# Patient Record
Sex: Male | Born: 1945 | Race: White | Hispanic: No | Marital: Married | Smoking: Former smoker
Health system: Southern US, Community
[De-identification: ages and names within clinical notes are randomized; demographics above are authoritative.]

## PROBLEM LIST (undated history)

## (undated) DIAGNOSIS — R7303 Prediabetes: Secondary | ICD-10-CM

## (undated) DIAGNOSIS — J302 Other seasonal allergic rhinitis: Secondary | ICD-10-CM

## (undated) DIAGNOSIS — R569 Unspecified convulsions: Secondary | ICD-10-CM

## (undated) DIAGNOSIS — E785 Hyperlipidemia, unspecified: Secondary | ICD-10-CM

## (undated) DIAGNOSIS — Z8613 Personal history of malaria: Secondary | ICD-10-CM

## (undated) DIAGNOSIS — Z9109 Other allergy status, other than to drugs and biological substances: Secondary | ICD-10-CM

## (undated) DIAGNOSIS — G44009 Cluster headache syndrome, unspecified, not intractable: Secondary | ICD-10-CM

## (undated) DIAGNOSIS — C155 Malignant neoplasm of lower third of esophagus: Secondary | ICD-10-CM

## (undated) DIAGNOSIS — Z8619 Personal history of other infectious and parasitic diseases: Secondary | ICD-10-CM

## (undated) DIAGNOSIS — I10 Essential (primary) hypertension: Secondary | ICD-10-CM

## (undated) DIAGNOSIS — M1990 Unspecified osteoarthritis, unspecified site: Secondary | ICD-10-CM

## (undated) DIAGNOSIS — I2510 Atherosclerotic heart disease of native coronary artery without angina pectoris: Secondary | ICD-10-CM

## (undated) DIAGNOSIS — I82409 Acute embolism and thrombosis of unspecified deep veins of unspecified lower extremity: Secondary | ICD-10-CM

## (undated) DIAGNOSIS — I2699 Other pulmonary embolism without acute cor pulmonale: Secondary | ICD-10-CM

## (undated) HISTORY — DX: Unspecified osteoarthritis, unspecified site: M19.90

## (undated) HISTORY — DX: Personal history of other infectious and parasitic diseases: Z86.19

## (undated) HISTORY — DX: Malignant neoplasm of lower third of esophagus: C15.5

## (undated) HISTORY — DX: Hyperlipidemia, unspecified: E78.5

## (undated) HISTORY — DX: Essential (primary) hypertension: I10

## (undated) HISTORY — DX: Cluster headache syndrome, unspecified, not intractable: G44.009

## (undated) HISTORY — DX: Personal history of malaria: Z86.13

## (undated) HISTORY — DX: Other seasonal allergic rhinitis: J30.2

## (undated) HISTORY — DX: Other pulmonary embolism without acute cor pulmonale: I26.99

## (undated) HISTORY — PX: TOTAL KNEE ARTHROPLASTY: SHX125

## (undated) HISTORY — DX: Prediabetes: R73.03

---

## 1967-04-05 DIAGNOSIS — Z8613 Personal history of malaria: Secondary | ICD-10-CM

## 1967-04-05 HISTORY — DX: Personal history of malaria: Z86.13

## 1999-11-03 HISTORY — PX: ANKLE SURGERY: SHX546

## 2002-02-02 HISTORY — PX: JOINT REPLACEMENT: SHX530

## 2004-05-03 ENCOUNTER — Ambulatory Visit: Payer: Self-pay | Admitting: Family Medicine

## 2005-04-04 DIAGNOSIS — R7303 Prediabetes: Secondary | ICD-10-CM

## 2005-04-04 HISTORY — DX: Prediabetes: R73.03

## 2006-04-04 HISTORY — PX: COLONOSCOPY: SHX174

## 2006-04-04 LAB — HM COLONOSCOPY

## 2006-11-22 ENCOUNTER — Ambulatory Visit: Payer: Self-pay | Admitting: Unknown Physician Specialty

## 2006-11-27 ENCOUNTER — Ambulatory Visit: Payer: Self-pay | Admitting: General Practice

## 2011-09-12 DIAGNOSIS — J309 Allergic rhinitis, unspecified: Secondary | ICD-10-CM | POA: Diagnosis not present

## 2012-12-31 DIAGNOSIS — M1710 Unilateral primary osteoarthritis, unspecified knee: Secondary | ICD-10-CM | POA: Diagnosis not present

## 2013-01-10 DIAGNOSIS — M1710 Unilateral primary osteoarthritis, unspecified knee: Secondary | ICD-10-CM | POA: Diagnosis not present

## 2013-01-11 DIAGNOSIS — Z23 Encounter for immunization: Secondary | ICD-10-CM | POA: Diagnosis not present

## 2013-02-01 DIAGNOSIS — J209 Acute bronchitis, unspecified: Secondary | ICD-10-CM | POA: Diagnosis not present

## 2013-02-01 DIAGNOSIS — J069 Acute upper respiratory infection, unspecified: Secondary | ICD-10-CM | POA: Diagnosis not present

## 2013-02-05 ENCOUNTER — Ambulatory Visit: Payer: Self-pay | Admitting: General Practice

## 2013-02-05 DIAGNOSIS — Z823 Family history of stroke: Secondary | ICD-10-CM | POA: Diagnosis not present

## 2013-02-05 DIAGNOSIS — IMO0002 Reserved for concepts with insufficient information to code with codable children: Secondary | ICD-10-CM | POA: Diagnosis not present

## 2013-02-05 DIAGNOSIS — E119 Type 2 diabetes mellitus without complications: Secondary | ICD-10-CM | POA: Diagnosis not present

## 2013-02-05 DIAGNOSIS — G43909 Migraine, unspecified, not intractable, without status migrainosus: Secondary | ICD-10-CM | POA: Diagnosis not present

## 2013-02-05 DIAGNOSIS — Z01812 Encounter for preprocedural laboratory examination: Secondary | ICD-10-CM | POA: Diagnosis not present

## 2013-02-05 DIAGNOSIS — Z79899 Other long term (current) drug therapy: Secondary | ICD-10-CM | POA: Diagnosis not present

## 2013-02-05 DIAGNOSIS — Z7982 Long term (current) use of aspirin: Secondary | ICD-10-CM | POA: Diagnosis not present

## 2013-02-05 DIAGNOSIS — Z9109 Other allergy status, other than to drugs and biological substances: Secondary | ICD-10-CM | POA: Diagnosis not present

## 2013-02-05 DIAGNOSIS — I10 Essential (primary) hypertension: Secondary | ICD-10-CM | POA: Diagnosis not present

## 2013-02-05 DIAGNOSIS — Z9889 Other specified postprocedural states: Secondary | ICD-10-CM | POA: Diagnosis not present

## 2013-02-05 DIAGNOSIS — I119 Hypertensive heart disease without heart failure: Secondary | ICD-10-CM | POA: Diagnosis not present

## 2013-02-05 DIAGNOSIS — Z8249 Family history of ischemic heart disease and other diseases of the circulatory system: Secondary | ICD-10-CM | POA: Diagnosis not present

## 2013-02-05 DIAGNOSIS — Z01810 Encounter for preprocedural cardiovascular examination: Secondary | ICD-10-CM | POA: Diagnosis not present

## 2013-02-05 DIAGNOSIS — Z833 Family history of diabetes mellitus: Secondary | ICD-10-CM | POA: Diagnosis not present

## 2013-02-05 LAB — URINALYSIS, COMPLETE
Blood: NEGATIVE
Glucose,UR: NEGATIVE mg/dL (ref 0–75)
Ketone: NEGATIVE
Nitrite: NEGATIVE
Ph: 6 (ref 4.5–8.0)
Protein: NEGATIVE
RBC,UR: <1 /HPF (ref 0–5)
Specific Gravity: 1.006 (ref 1.003–1.030)
Squamous Epithelial: <1
WBC UR: <1 /HPF (ref 0–5)

## 2013-02-05 LAB — CBC
HCT: 41.4 % (ref 40.0–52.0)
MCH: 31.8 pg (ref 26.0–34.0)
MCV: 90 fL (ref 80–100)
Platelet: 204 10*3/uL (ref 150–440)
RDW: 13.6 % (ref 11.5–14.5)
WBC: 6.2 10*3/uL (ref 3.8–10.6)

## 2013-02-05 LAB — BASIC METABOLIC PANEL
Anion Gap: 3 — ABNORMAL LOW (ref 7–16)
Calcium, Total: 9.6 mg/dL (ref 8.5–10.1)
Co2: 29 mmol/L (ref 21–32)
Creatinine: 0.94 mg/dL (ref 0.60–1.30)
EGFR (Non-African Amer.): >60
Glucose: 97 mg/dL (ref 65–99)
Potassium: 4.1 mmol/L (ref 3.5–5.1)
Sodium: 137 mmol/L (ref 136–145)

## 2013-02-05 LAB — MRSA PCR SCREENING

## 2013-02-05 LAB — APTT: Activated PTT: 33.9 secs (ref 23.6–35.9)

## 2013-02-05 LAB — PROTIME-INR
INR: 1
Prothrombin Time: 13.6 secs (ref 11.5–14.7)

## 2013-02-20 ENCOUNTER — Inpatient Hospital Stay: Payer: Self-pay | Admitting: General Practice

## 2013-02-20 DIAGNOSIS — Z87891 Personal history of nicotine dependence: Secondary | ICD-10-CM | POA: Diagnosis not present

## 2013-02-20 DIAGNOSIS — M1710 Unilateral primary osteoarthritis, unspecified knee: Secondary | ICD-10-CM | POA: Diagnosis not present

## 2013-02-20 DIAGNOSIS — D62 Acute posthemorrhagic anemia: Secondary | ICD-10-CM | POA: Diagnosis not present

## 2013-02-20 DIAGNOSIS — Z471 Aftercare following joint replacement surgery: Secondary | ICD-10-CM | POA: Diagnosis not present

## 2013-02-20 DIAGNOSIS — E119 Type 2 diabetes mellitus without complications: Secondary | ICD-10-CM | POA: Diagnosis present

## 2013-02-20 DIAGNOSIS — G43909 Migraine, unspecified, not intractable, without status migrainosus: Secondary | ICD-10-CM | POA: Diagnosis present

## 2013-02-20 DIAGNOSIS — M129 Arthropathy, unspecified: Secondary | ICD-10-CM | POA: Diagnosis present

## 2013-02-20 DIAGNOSIS — Z7982 Long term (current) use of aspirin: Secondary | ICD-10-CM | POA: Diagnosis not present

## 2013-02-20 DIAGNOSIS — Z96659 Presence of unspecified artificial knee joint: Secondary | ICD-10-CM | POA: Diagnosis not present

## 2013-02-21 LAB — BASIC METABOLIC PANEL
BUN: 15 mg/dL (ref 7–18)
Calcium, Total: 8.3 mg/dL — ABNORMAL LOW (ref 8.5–10.1)
Chloride: 103 mmol/L (ref 98–107)
Creatinine: 0.96 mg/dL (ref 0.60–1.30)
EGFR (Non-African Amer.): >60
Glucose: 117 mg/dL — ABNORMAL HIGH (ref 65–99)
Osmolality: 268 (ref 275–301)
Potassium: 3.8 mmol/L (ref 3.5–5.1)

## 2013-02-21 LAB — HEMOGLOBIN: HGB: 11.5 g/dL — ABNORMAL LOW (ref 13.0–18.0)

## 2013-02-22 LAB — PLATELET COUNT: Platelet: 137 10*3/uL — ABNORMAL LOW (ref 150–440)

## 2013-02-22 LAB — BASIC METABOLIC PANEL
Anion Gap: 2 — ABNORMAL LOW (ref 7–16)
BUN: 13 mg/dL (ref 7–18)
Chloride: 107 mmol/L (ref 98–107)
Osmolality: 279 (ref 275–301)

## 2013-02-22 LAB — HEMOGLOBIN: HGB: 10.9 g/dL — ABNORMAL LOW (ref 13.0–18.0)

## 2013-02-23 DIAGNOSIS — Z96659 Presence of unspecified artificial knee joint: Secondary | ICD-10-CM | POA: Diagnosis not present

## 2013-02-23 DIAGNOSIS — I10 Essential (primary) hypertension: Secondary | ICD-10-CM | POA: Diagnosis not present

## 2013-02-23 DIAGNOSIS — Z7901 Long term (current) use of anticoagulants: Secondary | ICD-10-CM | POA: Diagnosis not present

## 2013-02-23 DIAGNOSIS — IMO0001 Reserved for inherently not codable concepts without codable children: Secondary | ICD-10-CM | POA: Diagnosis not present

## 2013-02-23 DIAGNOSIS — Z471 Aftercare following joint replacement surgery: Secondary | ICD-10-CM | POA: Diagnosis not present

## 2013-02-24 DIAGNOSIS — I10 Essential (primary) hypertension: Secondary | ICD-10-CM | POA: Diagnosis not present

## 2013-02-24 DIAGNOSIS — IMO0001 Reserved for inherently not codable concepts without codable children: Secondary | ICD-10-CM | POA: Diagnosis not present

## 2013-02-24 DIAGNOSIS — Z7901 Long term (current) use of anticoagulants: Secondary | ICD-10-CM | POA: Diagnosis not present

## 2013-02-24 DIAGNOSIS — Z96659 Presence of unspecified artificial knee joint: Secondary | ICD-10-CM | POA: Diagnosis not present

## 2013-02-24 DIAGNOSIS — Z471 Aftercare following joint replacement surgery: Secondary | ICD-10-CM | POA: Diagnosis not present

## 2013-02-25 DIAGNOSIS — I10 Essential (primary) hypertension: Secondary | ICD-10-CM | POA: Diagnosis not present

## 2013-02-25 DIAGNOSIS — Z7901 Long term (current) use of anticoagulants: Secondary | ICD-10-CM | POA: Diagnosis not present

## 2013-02-25 DIAGNOSIS — Z96659 Presence of unspecified artificial knee joint: Secondary | ICD-10-CM | POA: Diagnosis not present

## 2013-02-25 DIAGNOSIS — Z471 Aftercare following joint replacement surgery: Secondary | ICD-10-CM | POA: Diagnosis not present

## 2013-02-25 DIAGNOSIS — IMO0001 Reserved for inherently not codable concepts without codable children: Secondary | ICD-10-CM | POA: Diagnosis not present

## 2013-02-26 DIAGNOSIS — Z96659 Presence of unspecified artificial knee joint: Secondary | ICD-10-CM | POA: Diagnosis not present

## 2013-02-26 DIAGNOSIS — I10 Essential (primary) hypertension: Secondary | ICD-10-CM | POA: Diagnosis not present

## 2013-02-26 DIAGNOSIS — Z7901 Long term (current) use of anticoagulants: Secondary | ICD-10-CM | POA: Diagnosis not present

## 2013-02-26 DIAGNOSIS — Z471 Aftercare following joint replacement surgery: Secondary | ICD-10-CM | POA: Diagnosis not present

## 2013-02-26 DIAGNOSIS — IMO0001 Reserved for inherently not codable concepts without codable children: Secondary | ICD-10-CM | POA: Diagnosis not present

## 2013-02-27 DIAGNOSIS — IMO0001 Reserved for inherently not codable concepts without codable children: Secondary | ICD-10-CM | POA: Diagnosis not present

## 2013-02-27 DIAGNOSIS — Z96659 Presence of unspecified artificial knee joint: Secondary | ICD-10-CM | POA: Diagnosis not present

## 2013-02-27 DIAGNOSIS — Z471 Aftercare following joint replacement surgery: Secondary | ICD-10-CM | POA: Diagnosis not present

## 2013-02-27 DIAGNOSIS — Z7901 Long term (current) use of anticoagulants: Secondary | ICD-10-CM | POA: Diagnosis not present

## 2013-02-27 DIAGNOSIS — I10 Essential (primary) hypertension: Secondary | ICD-10-CM | POA: Diagnosis not present

## 2013-02-28 DIAGNOSIS — Z96659 Presence of unspecified artificial knee joint: Secondary | ICD-10-CM | POA: Diagnosis not present

## 2013-02-28 DIAGNOSIS — Z7901 Long term (current) use of anticoagulants: Secondary | ICD-10-CM | POA: Diagnosis not present

## 2013-02-28 DIAGNOSIS — Z471 Aftercare following joint replacement surgery: Secondary | ICD-10-CM | POA: Diagnosis not present

## 2013-02-28 DIAGNOSIS — I10 Essential (primary) hypertension: Secondary | ICD-10-CM | POA: Diagnosis not present

## 2013-02-28 DIAGNOSIS — IMO0001 Reserved for inherently not codable concepts without codable children: Secondary | ICD-10-CM | POA: Diagnosis not present

## 2013-03-04 DIAGNOSIS — Z96659 Presence of unspecified artificial knee joint: Secondary | ICD-10-CM | POA: Diagnosis not present

## 2013-03-04 DIAGNOSIS — Z471 Aftercare following joint replacement surgery: Secondary | ICD-10-CM | POA: Diagnosis not present

## 2013-03-04 DIAGNOSIS — I10 Essential (primary) hypertension: Secondary | ICD-10-CM | POA: Diagnosis not present

## 2013-03-04 DIAGNOSIS — Z7901 Long term (current) use of anticoagulants: Secondary | ICD-10-CM | POA: Diagnosis not present

## 2013-03-04 DIAGNOSIS — IMO0001 Reserved for inherently not codable concepts without codable children: Secondary | ICD-10-CM | POA: Diagnosis not present

## 2013-03-05 DIAGNOSIS — IMO0001 Reserved for inherently not codable concepts without codable children: Secondary | ICD-10-CM | POA: Diagnosis not present

## 2013-03-05 DIAGNOSIS — Z96659 Presence of unspecified artificial knee joint: Secondary | ICD-10-CM | POA: Diagnosis not present

## 2013-03-05 DIAGNOSIS — Z471 Aftercare following joint replacement surgery: Secondary | ICD-10-CM | POA: Diagnosis not present

## 2013-03-05 DIAGNOSIS — Z7901 Long term (current) use of anticoagulants: Secondary | ICD-10-CM | POA: Diagnosis not present

## 2013-03-05 DIAGNOSIS — I10 Essential (primary) hypertension: Secondary | ICD-10-CM | POA: Diagnosis not present

## 2013-03-06 DIAGNOSIS — Z96659 Presence of unspecified artificial knee joint: Secondary | ICD-10-CM | POA: Diagnosis not present

## 2013-03-06 DIAGNOSIS — IMO0001 Reserved for inherently not codable concepts without codable children: Secondary | ICD-10-CM | POA: Diagnosis not present

## 2013-03-06 DIAGNOSIS — Z7901 Long term (current) use of anticoagulants: Secondary | ICD-10-CM | POA: Diagnosis not present

## 2013-03-06 DIAGNOSIS — I10 Essential (primary) hypertension: Secondary | ICD-10-CM | POA: Diagnosis not present

## 2013-03-06 DIAGNOSIS — Z471 Aftercare following joint replacement surgery: Secondary | ICD-10-CM | POA: Diagnosis not present

## 2013-03-07 DIAGNOSIS — M25569 Pain in unspecified knee: Secondary | ICD-10-CM | POA: Diagnosis not present

## 2013-03-07 DIAGNOSIS — M25669 Stiffness of unspecified knee, not elsewhere classified: Secondary | ICD-10-CM | POA: Diagnosis not present

## 2013-03-07 DIAGNOSIS — M6281 Muscle weakness (generalized): Secondary | ICD-10-CM | POA: Diagnosis not present

## 2013-03-11 DIAGNOSIS — M6281 Muscle weakness (generalized): Secondary | ICD-10-CM | POA: Diagnosis not present

## 2013-03-11 DIAGNOSIS — M25669 Stiffness of unspecified knee, not elsewhere classified: Secondary | ICD-10-CM | POA: Diagnosis not present

## 2013-03-11 DIAGNOSIS — M25569 Pain in unspecified knee: Secondary | ICD-10-CM | POA: Diagnosis not present

## 2013-03-13 DIAGNOSIS — M25569 Pain in unspecified knee: Secondary | ICD-10-CM | POA: Diagnosis not present

## 2013-03-13 DIAGNOSIS — M25669 Stiffness of unspecified knee, not elsewhere classified: Secondary | ICD-10-CM | POA: Diagnosis not present

## 2013-03-13 DIAGNOSIS — M6281 Muscle weakness (generalized): Secondary | ICD-10-CM | POA: Diagnosis not present

## 2013-03-15 DIAGNOSIS — M25569 Pain in unspecified knee: Secondary | ICD-10-CM | POA: Diagnosis not present

## 2013-03-15 DIAGNOSIS — M6281 Muscle weakness (generalized): Secondary | ICD-10-CM | POA: Diagnosis not present

## 2013-03-15 DIAGNOSIS — M25669 Stiffness of unspecified knee, not elsewhere classified: Secondary | ICD-10-CM | POA: Diagnosis not present

## 2013-03-18 DIAGNOSIS — M25669 Stiffness of unspecified knee, not elsewhere classified: Secondary | ICD-10-CM | POA: Diagnosis not present

## 2013-03-18 DIAGNOSIS — M6281 Muscle weakness (generalized): Secondary | ICD-10-CM | POA: Diagnosis not present

## 2013-03-18 DIAGNOSIS — M25569 Pain in unspecified knee: Secondary | ICD-10-CM | POA: Diagnosis not present

## 2013-03-20 DIAGNOSIS — M6281 Muscle weakness (generalized): Secondary | ICD-10-CM | POA: Diagnosis not present

## 2013-03-20 DIAGNOSIS — M25669 Stiffness of unspecified knee, not elsewhere classified: Secondary | ICD-10-CM | POA: Diagnosis not present

## 2013-03-20 DIAGNOSIS — M25569 Pain in unspecified knee: Secondary | ICD-10-CM | POA: Diagnosis not present

## 2013-03-25 DIAGNOSIS — M25669 Stiffness of unspecified knee, not elsewhere classified: Secondary | ICD-10-CM | POA: Diagnosis not present

## 2013-03-25 DIAGNOSIS — M6281 Muscle weakness (generalized): Secondary | ICD-10-CM | POA: Diagnosis not present

## 2013-03-25 DIAGNOSIS — M25569 Pain in unspecified knee: Secondary | ICD-10-CM | POA: Diagnosis not present

## 2013-04-01 DIAGNOSIS — M6281 Muscle weakness (generalized): Secondary | ICD-10-CM | POA: Diagnosis not present

## 2013-04-01 DIAGNOSIS — M25569 Pain in unspecified knee: Secondary | ICD-10-CM | POA: Diagnosis not present

## 2013-04-01 DIAGNOSIS — M25669 Stiffness of unspecified knee, not elsewhere classified: Secondary | ICD-10-CM | POA: Diagnosis not present

## 2013-04-03 DIAGNOSIS — M25619 Stiffness of unspecified shoulder, not elsewhere classified: Secondary | ICD-10-CM | POA: Diagnosis not present

## 2013-04-03 DIAGNOSIS — M6281 Muscle weakness (generalized): Secondary | ICD-10-CM | POA: Diagnosis not present

## 2013-04-03 DIAGNOSIS — M25519 Pain in unspecified shoulder: Secondary | ICD-10-CM | POA: Diagnosis not present

## 2013-04-09 DIAGNOSIS — S8290XD Unspecified fracture of unspecified lower leg, subsequent encounter for closed fracture with routine healing: Secondary | ICD-10-CM | POA: Diagnosis not present

## 2013-04-16 ENCOUNTER — Ambulatory Visit (INDEPENDENT_AMBULATORY_CARE_PROVIDER_SITE_OTHER): Payer: Medicare Other | Admitting: Family Medicine

## 2013-04-16 ENCOUNTER — Encounter: Payer: Self-pay | Admitting: Family Medicine

## 2013-04-16 VITALS — BP 112/70 | HR 94 | Temp 98.1°F | Ht 75.0 in | Wt 264.5 lb

## 2013-04-16 DIAGNOSIS — E785 Hyperlipidemia, unspecified: Secondary | ICD-10-CM | POA: Diagnosis not present

## 2013-04-16 DIAGNOSIS — R7309 Other abnormal glucose: Secondary | ICD-10-CM | POA: Diagnosis not present

## 2013-04-16 DIAGNOSIS — J309 Allergic rhinitis, unspecified: Secondary | ICD-10-CM

## 2013-04-16 DIAGNOSIS — E119 Type 2 diabetes mellitus without complications: Secondary | ICD-10-CM | POA: Insufficient documentation

## 2013-04-16 DIAGNOSIS — R7303 Prediabetes: Secondary | ICD-10-CM

## 2013-04-16 DIAGNOSIS — J302 Other seasonal allergic rhinitis: Secondary | ICD-10-CM | POA: Insufficient documentation

## 2013-04-16 DIAGNOSIS — I10 Essential (primary) hypertension: Secondary | ICD-10-CM | POA: Diagnosis not present

## 2013-04-16 DIAGNOSIS — M1990 Unspecified osteoarthritis, unspecified site: Secondary | ICD-10-CM

## 2013-04-16 NOTE — Patient Instructions (Addendum)
Return as needed or at your convenience for medicare wellness visit. Good to meet you today, call us with questions.

## 2013-04-16 NOTE — Assessment & Plan Note (Signed)
A1c last year 6%.

## 2013-04-16 NOTE — Progress Notes (Signed)
Pre-visit discussion using our clinic review tool. No additional management support is needed unless otherwise documented below in the visit note.  

## 2013-04-16 NOTE — Assessment & Plan Note (Signed)
Chronic, very well controlled on current regimen. Continue.

## 2013-04-16 NOTE — Progress Notes (Signed)
Subjective:    Patient ID: Jason Cooper, male    DOB: 1945-06-06, 68 y.o.   MRN: [id number]  HPI CC: new pt to establish  [facility].  Left handed. Brings records from [place] on latest PSA (2.52), FLP (TC 143, trig 56, LDL 84, HDL 48) and A1c (6.0%).  HTN - dx 8 yrs ago.  Compliant with current antihypertensive regimen of lisinopril hctz daily.  Does check blood pressures at home: always well controlled.  No low blood pressure readings or symptoms of dizziness/syncope.  Denies HA, vision changes, CP/tightness, SOB, leg swelling.  HLD - stable on simvastatin.  No myalgias.  Hand tremors - told intentional tremor by [facility].  Saw neurologist and reassured not parkinson's.  Lives with wife, no pets Occupation: retired Engineer, structural Edu: graduate degree Activity: enjoys traveling, rides stationary bike every morning 30 min, walking 2 mi/day, goes to [employer] Diet: good water, fruits/vegetables daily, some fish  Preventative: Colonoscopy 2008 WNL [name]) Prostate - checked yearly Flu shot - done this year Tetanus 2011 Pneumovax 2011 Shingles shot - 2008 Advanced directives: wife is HCPOA.  Has living will and he will bring me copy.  Medications and allergies reviewed and updated in chart.  Past histories reviewed and updated if relevant as below. There are no active problems to display for this patient.  Past Medical History  Diagnosis Date  . Arthritis   . History of chicken pox   . Prediabetes 2007  . Seasonal allergic rhinitis   . HTN (hypertension)   . HLD (hyperlipidemia)   . Cluster headache     as 68 yo, no more since retired (25 yrs ago)  . H/O malaria 1969    during [place] (hospitalized in [place])   Past Surgical History  Procedure Laterality Date  . Total knee arthroplasty Bilateral 2003, 2014    right-2003; left-2014  . 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 Smoking  status: Former Smoker    Quit date: 06/02/1997  . Smokeless tobacco: Never Used  . Alcohol Use: No   Family History  Problem Relation Age of Onset  . Hypertension Mother   . Stroke Mother   . Stroke Other     maternal and paternal grandparents  . Hypertension Father   . Diabetes Father   . Cancer Paternal Aunt     bone  . Cancer Paternal Aunt     pancreas  . Cancer Cousin     prostate  . CAD Neg Hx    No Known Allergies No current outpatient prescriptions on file prior to visit.   No current facility-administered medications on file prior to visit.    Review of Systems Per HPI    Objective:   Physical Exam  Nursing note and vitals reviewed. Constitutional: He is oriented to person, place, and time. He appears well-developed and well-nourished. No distress.  HENT:  Head: Normocephalic and atraumatic.  Right Ear: Hearing, tympanic membrane, external ear and ear canal normal.  Left Ear: Hearing, tympanic membrane, external ear and ear canal normal.  Nose: Nose normal.  Mouth/Throat: Uvula is midline, oropharynx is clear and moist and mucous membranes are normal. No oropharyngeal exudate, posterior oropharyngeal edema, posterior oropharyngeal erythema or tonsillar abscesses.  Wax in canals  Eyes: Conjunctivae and EOM are normal. Pupils are equal, round, and reactive to light. No scleral icterus.  Neck: Normal range of motion.  Neck supple. Carotid bruit is not present. No thyromegaly present.  Cardiovascular: Normal rate, regular rhythm, normal heart sounds and intact distal pulses.   No murmur heard. Pulses:      Radial pulses are 2+ on the right side, and 2+ on the left side.  Pulmonary/Chest: Effort normal and breath sounds normal. No respiratory distress. He has no wheezes. He has no rales.  Musculoskeletal: Normal range of motion. He exhibits edema (tr pedal edema).  Lymphadenopathy:    He has no cervical adenopathy.  Neurological: He is alert and oriented to person,  place, and time.  CN grossly intact, station and gait intact  Skin: Skin is warm and dry. No rash noted.  Psychiatric: He has a normal mood and affect. His behavior is normal. Judgment and thought content normal.       Assessment & Plan:

## 2013-04-16 NOTE — Assessment & Plan Note (Signed)
Chronic, stable on current regimen. Continue.  

## 2013-04-17 ENCOUNTER — Telehealth: Payer: Self-pay | Admitting: Family Medicine

## 2013-04-17 NOTE — Telephone Encounter (Signed)
Relevant patient education assigned to patient using Emmi. ° °

## 2013-06-11 DIAGNOSIS — Z96659 Presence of unspecified artificial knee joint: Secondary | ICD-10-CM | POA: Diagnosis not present

## 2013-06-11 DIAGNOSIS — Z471 Aftercare following joint replacement surgery: Secondary | ICD-10-CM | POA: Diagnosis not present

## 2013-07-09 ENCOUNTER — Ambulatory Visit (INDEPENDENT_AMBULATORY_CARE_PROVIDER_SITE_OTHER): Payer: Medicare Other | Admitting: Family Medicine

## 2013-07-09 ENCOUNTER — Encounter: Payer: Self-pay | Admitting: Family Medicine

## 2013-07-09 VITALS — BP 120/72 | HR 65 | Temp 97.8°F

## 2013-07-09 DIAGNOSIS — Z125 Encounter for screening for malignant neoplasm of prostate: Secondary | ICD-10-CM

## 2013-07-09 DIAGNOSIS — I10 Essential (primary) hypertension: Secondary | ICD-10-CM | POA: Diagnosis not present

## 2013-07-09 DIAGNOSIS — R7309 Other abnormal glucose: Secondary | ICD-10-CM | POA: Diagnosis not present

## 2013-07-09 DIAGNOSIS — M545 Low back pain, unspecified: Secondary | ICD-10-CM | POA: Diagnosis not present

## 2013-07-09 DIAGNOSIS — Z0000 Encounter for general adult medical examination without abnormal findings: Secondary | ICD-10-CM | POA: Insufficient documentation

## 2013-07-09 DIAGNOSIS — R7303 Prediabetes: Secondary | ICD-10-CM

## 2013-07-09 DIAGNOSIS — J302 Other seasonal allergic rhinitis: Secondary | ICD-10-CM

## 2013-07-09 DIAGNOSIS — E785 Hyperlipidemia, unspecified: Secondary | ICD-10-CM | POA: Diagnosis not present

## 2013-07-09 DIAGNOSIS — Z23 Encounter for immunization: Secondary | ICD-10-CM

## 2013-07-09 LAB — BASIC METABOLIC PANEL
BUN: 15 mg/dL (ref 6–23)
CALCIUM: 10.2 mg/dL (ref 8.4–10.5)
CO2: 30 mEq/L (ref 19–32)
CREATININE: 1 mg/dL (ref 0.4–1.5)
Chloride: 103 mEq/L (ref 96–112)
GFR: 84.09 mL/min (ref >60.00)
Glucose, Bld: 104 mg/dL — ABNORMAL HIGH (ref 70–99)
Potassium: 4.7 mEq/L (ref 3.5–5.1)
Sodium: 138 mEq/L (ref 135–145)

## 2013-07-09 LAB — LIPID PANEL
Cholesterol: 152 mg/dL (ref 0–200)
HDL: 43.2 mg/dL (ref >39.00)
LDL CALC: 90 mg/dL (ref 0–99)
Total CHOL/HDL Ratio: 4
Triglycerides: 96 mg/dL (ref 0.0–149.0)
VLDL: 19.2 mg/dL (ref 0.0–40.0)

## 2013-07-09 LAB — HEMOGLOBIN A1C: HEMOGLOBIN A1C: 6.4 % (ref 4.6–6.5)

## 2013-07-09 LAB — PSA, MEDICARE: PSA: 3.19 ng/mL (ref 0.10–4.00)

## 2013-07-09 MED ORDER — NAPROXEN 500 MG PO TABS: ORAL_TABLET | ORAL | Status: DC

## 2013-07-09 NOTE — Assessment & Plan Note (Signed)
I have personally reviewed the Medicare Annual Wellness questionnaire and have noted 1. The patient's medical and social history 2. Their use of alcohol, tobacco or illicit drugs 3. Their current medications and supplements 4. The patient's functional ability including ADL's, fall risks, home safety risks and hearing or visual impairment. 5. Diet and physical activity 6. Evidence for depression or mood disorders The patients weight, height, BMI have been recorded in the chart.  Hearing and vision has been addressed. I have made referrals, counseling and provided education to the patient based review of the above and I have provided the pt with a written personalized care plan for preventive services. See scanned questionairre. Advanced directives discussed: wife is HCPOA then son. Has living will and he brought me copy today which I will scan.  Reviewed preventative protocols and updated unless pt declined. prevnar today. DRE/PSA today.

## 2013-07-09 NOTE — Progress Notes (Signed)
BP 120/72  Pulse 65  Temp(Src) 97.8 F (36.6 C) (Oral)  SpO2 97%   CC: medicare wellness visit  Subjective:    Patient ID: Jason Cooper, male    DOB: 09-02-1945, 68 y.o.   MRN: [id number]  HPI: Jason Cooper is a 68 y.o. male presenting on 07/09/2013 for Annual Exam   Some lower back pain started last week after overdoing it working in the yard.  Then over weekend acutely worsened.  Describes right lower back pain.  No radiation down leg.  No numbness or weakness of leg.  No fevers, no bowel/bladder incontinence.  Lives with wife, no pets  Occupation: retired Engineer, structural  Edu: graduate degree  Activity: enjoys traveling, rides stationary bike every morning 30 min, walking 2 mi/day, goes to [employer]  Diet: good water, fruits/vegetables daily, some fish   Hearing screen passed. Vision screen done at [facility] Denies falls/depression or anhedonia.  Preventative: Colonoscopy 2008 WNL [name])  Prostate - requests checked today. Flu shot - done this year  Tetanus 2011  Pneumovax 2011, prevnar today. Shingles shot - 2008  Advanced directives: wife is HCPOA then son. Has living will and he brought me copy today which I will scan.   Relevant past medical, surgical, family and social history reviewed and updated as indicated.  Allergies and medications reviewed and updated. Current Outpatient Prescriptions on File Prior to Visit  Medication Sig  . aspirin EC 81 MG tablet Take 81 mg by mouth daily.  . Cholecalciferol (VITAMIN D-3) 1000 UNITS CAPS Take 1 capsule by mouth daily.  Marland Kitchen lisinopril-hydrochlorothiazide (PRINZIDE,ZESTORETIC) 20-12.5 MG per tablet Take 1 tablet by mouth daily.  Marland Kitchen loratadine (CLARITIN) 10 MG tablet Take 10 mg by mouth daily as needed for allergies.  . Multiple Vitamin (MULTIVITAMIN) tablet Take 1 tablet by mouth daily.  . simvastatin (ZOCOR) 40 MG tablet Take 40 mg by mouth daily.   No current facility-administered medications on file prior to visit.    Review  of Systems  Constitutional: Negative for fever, chills, activity change, appetite change, fatigue and unexpected weight change.  HENT: Negative for hearing loss.   Eyes: Negative for visual disturbance (eye exam scheduled 01/2014 at [facility]).  Respiratory: Negative for cough, chest tightness, shortness of breath and wheezing.   Cardiovascular: Negative for chest pain, palpitations and leg swelling.  Gastrointestinal: Negative for nausea, vomiting, abdominal pain, diarrhea, constipation, blood in stool and abdominal distention.  Genitourinary: Negative for hematuria and difficulty urinating.  Musculoskeletal: Positive for back pain (see HPI). Negative for arthralgias, myalgias and neck pain.  Skin: Negative for rash.  Neurological: Negative for dizziness, seizures, syncope and headaches.  Hematological: Negative for adenopathy. Does not bruise/bleed easily.  Psychiatric/Behavioral: Negative for dysphoric mood. The patient is not nervous/anxious.    Per HPI unless specifically indicated above    Objective:    BP 120/72  Pulse 65  Temp(Src) 97.8 F (36.6 C) (Oral)  SpO2 97%  Physical Exam  Nursing note and vitals reviewed. Constitutional: He is oriented to person, place, and time. He appears well-developed and well-nourished. No distress.  HENT:  Head: Normocephalic and atraumatic.  Right Ear: Hearing, tympanic membrane, external ear and ear canal normal.  Left Ear: Hearing, tympanic membrane, external ear and ear canal normal.  Nose: Nose normal.  Mouth/Throat: Uvula is midline, oropharynx is clear and moist and mucous membranes are normal. No oropharyngeal exudate, posterior oropharyngeal edema or posterior oropharyngeal erythema.  Eyes: Conjunctivae and EOM are normal. Pupils are equal, round,  and reactive to light. No scleral icterus.  Neck: Normal range of motion. Neck supple. Carotid bruit is not present. No thyromegaly present.  Cardiovascular: Normal rate, regular rhythm, normal  heart sounds and intact distal pulses.   No murmur heard. Pulses:      Radial pulses are 2+ on the right side, and 2+ on the left side.  Pulmonary/Chest: Effort normal and breath sounds normal. No respiratory distress. He has no wheezes. He has no rales.  Abdominal: Soft. Bowel sounds are normal. He exhibits no distension and no mass. There is no tenderness. There is no rebound and no guarding.  Genitourinary: Prostate normal. Rectal exam shows external hemorrhoid (noninflamed). Rectal exam shows no internal hemorrhoid, no fissure, no mass, no tenderness and anal tone normal. Prostate is not enlarged (20gm) and not tender.  Musculoskeletal: Normal range of motion. He exhibits no edema.  Uncomfortable with palpation midline lower lumbar spine Mild paraspinous mm tenderness Neg SLR bilaterally. No pain with int/ext rotation at hip. Neg FABER. No pain at SIJ, GTB or sciatic notch bilaterally.  Lymphadenopathy:    He has no cervical adenopathy.  Neurological: He is alert and oriented to person, place, and time.  CN grossly intact, station and gait intact Recall 3/3 Concentration 3/5 serial 7s, 5/5 D L R O W  Skin: Skin is warm and dry. No rash noted.  Psychiatric: He has a normal mood and affect. His behavior is normal. Judgment and thought content normal.   Results for orders placed in visit on 07/09/13  HM COLONOSCOPY      Result Value Ref Range   HM Colonoscopy per pt WNL        Assessment & Plan:   Problem List Items Addressed This Visit   HTN (hypertension)     Chronic, stable. Continue lisinopril hctz.    Relevant Orders      Basic metabolic panel   HLD (hyperlipidemia)     Chronic, check FLP today.    Relevant Orders      Lipid panel   Prediabetes     Recheck A1c today.    Relevant Orders      Hemoglobin A1c   Seasonal allergic rhinitis     Stable on prn claritin.    Medicare annual wellness visit, initial - Primary     I have personally reviewed the Medicare  Annual Wellness questionnaire and have noted 1. The patient's medical and social history 2. Their use of alcohol, tobacco or illicit drugs 3. Their current medications and supplements 4. The patient's functional ability including ADL's, fall risks, home safety risks and hearing or visual impairment. 5. Diet and physical activity 6. Evidence for depression or mood disorders The patients weight, height, BMI have been recorded in the chart.  Hearing and vision has been addressed. I have made referrals, counseling and provided education to the patient based review of the above and I have provided the pt with a written personalized care plan for preventive services. See scanned questionairre. Advanced directives discussed: wife is HCPOA then son. Has living will and he brought me copy today which I will scan.  Reviewed preventative protocols and updated unless pt declined. prevnar today. DRE/PSA today.    Acute lumbar back pain     No red flags, no radiculopathy sxs. Anticipate lumbar strain - treat with naprosyn 500mg  bid with meals x 5 days. Pt declines stronger pain med, pt declines muscle relaxant. Provided with stretching exercises from [facility] pt advisor. Upcoming trip to  [place] for family reunion - discussed taking breaks during long car rides. Update if sxs persist or fail to improve.    Relevant Medications      naproxen (NAPROSYN) tablet    Other Visit Diagnoses   Special screening for malignant neoplasm of prostate        Relevant Orders       PSA, Medicare        Follow up plan: Return in about 1 year (around 07/10/2014), or as needed, for annual exam, prior fasting for blood work.

## 2013-07-09 NOTE — Assessment & Plan Note (Signed)
No red flags, no radiculopathy sxs. Anticipate lumbar strain - treat with naprosyn 500mg  bid with meals x 5 days. Pt declines stronger pain med, pt declines muscle relaxant. Provided with stretching exercises from [facility] pt advisor. Upcoming trip to [place] for family reunion - discussed taking breaks during long car rides. Update if sxs persist or fail to improve.

## 2013-07-09 NOTE — Assessment & Plan Note (Signed)
Stable on prn claritin.

## 2013-07-09 NOTE — Progress Notes (Signed)
Pre visit review using our clinic review tool, if applicable. No additional management support is needed unless otherwise documented below in the visit note. 

## 2013-07-09 NOTE — Assessment & Plan Note (Signed)
Chronic, check FLP today.

## 2013-07-09 NOTE — Assessment & Plan Note (Signed)
Chronic, stable. Continue lisinopril hctz.  

## 2013-07-09 NOTE — Assessment & Plan Note (Signed)
Recheck A1c today 

## 2013-07-09 NOTE — Patient Instructions (Addendum)
prevnar today (2nd pneumonia shot) Blood work today. For back - I think you have a lumbar strain - treat with naprosyn twice daily with food for 5 days then as needed as well as do stretching exercises provided.  Let me know if changing or worsening symptoms. Good to see you today, call us with quesitons.

## 2013-07-09 NOTE — Addendum Note (Signed)
Addended by: Tammi Sou on: 07/09/2013 09:15 AM   Modules accepted: Orders

## 2013-07-10 ENCOUNTER — Encounter: Payer: Self-pay | Admitting: *Deleted

## 2013-12-15 DIAGNOSIS — Z23 Encounter for immunization: Secondary | ICD-10-CM | POA: Diagnosis not present

## 2014-06-26 DIAGNOSIS — Z96651 Presence of right artificial knee joint: Secondary | ICD-10-CM | POA: Diagnosis not present

## 2014-06-26 DIAGNOSIS — Z96652 Presence of left artificial knee joint: Secondary | ICD-10-CM | POA: Diagnosis not present

## 2014-06-29 DIAGNOSIS — Z96659 Presence of unspecified artificial knee joint: Secondary | ICD-10-CM | POA: Insufficient documentation

## 2014-07-02 ENCOUNTER — Other Ambulatory Visit: Payer: Self-pay | Admitting: Family Medicine

## 2014-07-02 DIAGNOSIS — R7303 Prediabetes: Secondary | ICD-10-CM

## 2014-07-02 DIAGNOSIS — Z125 Encounter for screening for malignant neoplasm of prostate: Secondary | ICD-10-CM

## 2014-07-02 DIAGNOSIS — E785 Hyperlipidemia, unspecified: Secondary | ICD-10-CM

## 2014-07-02 DIAGNOSIS — I10 Essential (primary) hypertension: Secondary | ICD-10-CM

## 2014-07-04 ENCOUNTER — Other Ambulatory Visit (INDEPENDENT_AMBULATORY_CARE_PROVIDER_SITE_OTHER): Payer: Medicare Other

## 2014-07-04 DIAGNOSIS — R7309 Other abnormal glucose: Secondary | ICD-10-CM | POA: Diagnosis not present

## 2014-07-04 DIAGNOSIS — Z125 Encounter for screening for malignant neoplasm of prostate: Secondary | ICD-10-CM

## 2014-07-04 DIAGNOSIS — I10 Essential (primary) hypertension: Secondary | ICD-10-CM | POA: Diagnosis not present

## 2014-07-04 DIAGNOSIS — R7303 Prediabetes: Secondary | ICD-10-CM

## 2014-07-04 DIAGNOSIS — E785 Hyperlipidemia, unspecified: Secondary | ICD-10-CM

## 2014-07-04 LAB — COMPREHENSIVE METABOLIC PANEL
ALK PHOS: 73 U/L (ref 39–117)
ALT: 26 U/L (ref 0–53)
AST: 22 U/L (ref 0–37)
Albumin: 4 g/dL (ref 3.5–5.2)
BILIRUBIN TOTAL: 0.6 mg/dL (ref 0.2–1.2)
BUN: 17 mg/dL (ref 6–23)
CHLORIDE: 103 meq/L (ref 96–112)
CO2: 32 mEq/L (ref 19–32)
Calcium: 9.9 mg/dL (ref 8.4–10.5)
Creatinine, Ser: 0.98 mg/dL (ref 0.40–1.50)
GFR: 80.88 mL/min (ref >60.00)
Glucose, Bld: 114 mg/dL — ABNORMAL HIGH (ref 70–99)
Potassium: 4.8 mEq/L (ref 3.5–5.1)
Sodium: 137 mEq/L (ref 135–145)
Total Protein: 6.5 g/dL (ref 6.0–8.3)

## 2014-07-04 LAB — LIPID PANEL
Cholesterol: 141 mg/dL (ref 0–200)
HDL: 40.1 mg/dL (ref >39.00)
LDL Cholesterol: 86 mg/dL (ref 0–99)
NONHDL: 100.9
Total CHOL/HDL Ratio: 4
Triglycerides: 73 mg/dL (ref 0.0–149.0)
VLDL: 14.6 mg/dL (ref 0.0–40.0)

## 2014-07-04 LAB — PSA, MEDICARE: PSA: 3.16 ng/mL (ref 0.10–4.00)

## 2014-07-04 LAB — HEMOGLOBIN A1C: Hgb A1c MFr Bld: 6.2 % (ref 4.6–6.5)

## 2014-07-11 ENCOUNTER — Ambulatory Visit (INDEPENDENT_AMBULATORY_CARE_PROVIDER_SITE_OTHER): Payer: Medicare Other | Admitting: Family Medicine

## 2014-07-11 ENCOUNTER — Encounter: Payer: Self-pay | Admitting: Family Medicine

## 2014-07-11 VITALS — BP 112/70 | HR 68 | Temp 97.8°F | Ht 75.0 in | Wt 276.0 lb

## 2014-07-11 DIAGNOSIS — H918X1 Other specified hearing loss, right ear: Secondary | ICD-10-CM | POA: Diagnosis not present

## 2014-07-11 DIAGNOSIS — H6121 Impacted cerumen, right ear: Secondary | ICD-10-CM | POA: Diagnosis not present

## 2014-07-11 DIAGNOSIS — I10 Essential (primary) hypertension: Secondary | ICD-10-CM

## 2014-07-11 DIAGNOSIS — Z0000 Encounter for general adult medical examination without abnormal findings: Secondary | ICD-10-CM

## 2014-07-11 DIAGNOSIS — J302 Other seasonal allergic rhinitis: Secondary | ICD-10-CM

## 2014-07-11 DIAGNOSIS — E785 Hyperlipidemia, unspecified: Secondary | ICD-10-CM

## 2014-07-11 DIAGNOSIS — Z7189 Other specified counseling: Secondary | ICD-10-CM | POA: Insufficient documentation

## 2014-07-11 DIAGNOSIS — R7303 Prediabetes: Secondary | ICD-10-CM

## 2014-07-11 NOTE — Assessment & Plan Note (Signed)
Chronic, stable. Continue regimen. 

## 2014-07-11 NOTE — Assessment & Plan Note (Signed)
Cerumen removed with plastic curette, then irrigation performed for residual deep cerumen.

## 2014-07-11 NOTE — Assessment & Plan Note (Signed)
Reviewed labs with patient. °

## 2014-07-11 NOTE — Assessment & Plan Note (Signed)

## 2014-07-11 NOTE — Patient Instructions (Signed)
You are doing well today. Right ear irrigation today. Return as needed or in 1 year for next medicare wellness visit

## 2014-07-11 NOTE — Assessment & Plan Note (Signed)
Prn claritin

## 2014-07-11 NOTE — Progress Notes (Signed)
BP 112/70 mmHg  Pulse 68  Temp(Src) 97.8 F (36.6 C) (Oral)  Ht 6\' 3"  (1.905 m)  Wt 276 lb (125.193 kg)  BMI 34.50 kg/m2   CC: medicare wellness visit  Subjective:    Patient ID: Jason Cooper, male    DOB: [birth year], 69 y.o.   MRN: [id number]  HPI: Jason Cooper is a 69 y.o. male presenting on 07/11/2014 for Annual Exam   Hearing screen failed on right. Pt has noticed this trouble as well at home. interested in audiology referral Eye exam at [facility] Denies falls/depression or anhedonia.  Preventative: Colonoscopy 2008 WNL, rpt 10 yrs [name])  Prostate - requests checked today. Flu shot - 12/2013 Tetanus 2011  Pneumovax 2011, prevnar 07/2013 Shingles shot - 2008  Advanced directives: wife is HCPOA then son. Advanced directives in chart (07/2013).  Lives with wife, no pets  Occupation: retired Engineer, structural  Edu: graduate degree  Activity: enjoys traveling, rides stationary bike every morning 30 min, walking 2 mi/day, goes to [employer]  Diet: good water, fruits/vegetables daily, some fish   Relevant past medical, surgical, family and social history reviewed and updated as indicated. Interim medical history since our last visit reviewed. Allergies and medications reviewed and updated. Current Outpatient Prescriptions on File Prior to Visit  Medication Sig  . aspirin EC 81 MG tablet Take 81 mg by mouth daily.  . Cholecalciferol (VITAMIN D-3) 1000 UNITS CAPS Take 1 capsule by mouth daily.  Marland Kitchen lisinopril-hydrochlorothiazide (PRINZIDE,ZESTORETIC) 20-12.5 MG per tablet Take 1 tablet by mouth daily.  Marland Kitchen loratadine (CLARITIN) 10 MG tablet Take 10 mg by mouth daily as needed for allergies.  . Multiple Vitamin (MULTIVITAMIN) tablet Take 1 tablet by mouth daily.  . simvastatin (ZOCOR) 40 MG tablet Take 40 mg by mouth daily.   No current facility-administered medications on file prior to visit.    Review of Systems Per HPI unless specifically indicated above     Objective:      BP 112/70 mmHg  Pulse 68  Temp(Src) 97.8 F (36.6 C) (Oral)  Ht 6\' 3"  (1.905 m)  Wt 276 lb (125.193 kg)  BMI 34.50 kg/m2  Wt Readings from Last 3 Encounters:  07/11/14 276 lb (125.193 kg)  04/16/13 264 lb 8 oz (119.976 kg)    Physical Exam  Constitutional: He is oriented to person, place, and time. He appears well-developed and well-nourished. No distress.  HENT:  Head: Normocephalic and atraumatic.  Right Ear: External ear normal. Decreased hearing is noted.  Left Ear: Hearing, tympanic membrane, external ear and ear canal normal.  Nose: Nose normal.  Mouth/Throat: Uvula is midline, oropharynx is clear and moist and mucous membranes are normal. No oropharyngeal exudate, posterior oropharyngeal edema or posterior oropharyngeal erythema.  R cerumen impaction cleared with plastic curette, but unable to get all wax out so irrigation performed.  Eyes: Conjunctivae and EOM are normal. Pupils are equal, round, and reactive to light. No scleral icterus.  Neck: Normal range of motion. Neck supple. Carotid bruit is not present. No thyromegaly present.  Cardiovascular: Normal rate, regular rhythm, normal heart sounds and intact distal pulses.   No murmur heard. Pulses:      Radial pulses are 2+ on the right side, and 2+ on the left side.  Pulmonary/Chest: Effort normal and breath sounds normal. No respiratory distress. He has no wheezes. He has no rales.  Abdominal: Soft. Bowel sounds are normal. He exhibits no distension and no mass. There is no tenderness. There is  no rebound and no guarding.  Genitourinary: Rectum normal. Rectal exam shows no external hemorrhoid, no internal hemorrhoid, no fissure, no mass, no tenderness and anal tone normal. Prostate is enlarged (20gm). Prostate is not tender.  Musculoskeletal: Normal range of motion. He exhibits no edema.  Lymphadenopathy:    He has no cervical adenopathy.  Neurological: He is alert and oriented to person, place, and time.  CN grossly  intact, station and gait intact Recall 3/3  Calculation 4/5 serial 7s  Skin: Skin is warm and dry. No rash noted.  Psychiatric: He has a normal mood and affect. His behavior is normal. Judgment and thought content normal.  Nursing note and vitals reviewed.  Results for orders placed or performed in visit on 07/04/14  Lipid panel  Result Value Ref Range   Cholesterol 141 0 - 200 mg/dL   Triglycerides 73.0 0.0 - 149.0 mg/dL   HDL 40.10 >39.00 mg/dL   VLDL 14.6 0.0 - 40.0 mg/dL   LDL Cholesterol 86 0 - 99 mg/dL   Total CHOL/HDL Ratio 4    NonHDL 100.90   Comprehensive metabolic panel  Result Value Ref Range   Sodium 137 135 - 145 mEq/L   Potassium 4.8 3.5 - 5.1 mEq/L   Chloride 103 96 - 112 mEq/L   CO2 32 19 - 32 mEq/L   Glucose, Bld 114 (H) 70 - 99 mg/dL   BUN 17 6 - 23 mg/dL   Creatinine, Ser 0.98 0.40 - 1.50 mg/dL   Total Bilirubin 0.6 0.2 - 1.2 mg/dL   Alkaline Phosphatase 73 39 - 117 U/L   AST 22 0 - 37 U/L   ALT 26 0 - 53 U/L   Total Protein 6.5 6.0 - 8.3 g/dL   Albumin 4.0 3.5 - 5.2 g/dL   Calcium 9.9 8.4 - 10.5 mg/dL   GFR 80.88 >60.00 mL/min  Hemoglobin A1c  Result Value Ref Range   Hgb A1c MFr Bld 6.2 4.6 - 6.5 %  PSA, Medicare  Result Value Ref Range   PSA 3.16 0.10 - 4.00 ng/ml      Assessment & Plan:   Problem List Items Addressed This Visit    Seasonal allergic rhinitis    Prn claritin      Prediabetes    Reviewed labs with patient.      Medicare annual wellness visit, subsequent - Primary    I have personally reviewed the Medicare Annual Wellness questionnaire and have noted 1. The patient's medical and social history 2. Their use of alcohol, tobacco or illicit drugs 3. Their current medications and supplements 4. The patient's functional ability including ADL's, fall risks, home safety risks and hearing or visual impairment. 5. Diet and physical activity 6. Evidence for depression or mood disorders The patients weight, height, BMI have been  recorded in the chart.  Hearing and vision has been addressed. I have made referrals, counseling and provided education to the patient based review of the above and I have provided the pt with a written personalized care plan for preventive services. Provider list updated - see scanned questionairre. Reviewed preventative protocols and updated unless pt declined.       HTN (hypertension)    Chronic, stable. Continue regimen.      HLD (hyperlipidemia)    Chronic, stable. Continue regimen.      Hearing loss of right ear due to cerumen impaction    Cerumen removed with plastic curette, then irrigation performed for residual deep cerumen.  Advanced care planning/counseling discussion    Advanced directives: wife is HCPOA then son. Advanced directives in chart (07/2013).          Follow up plan: Return in about 1 year (around 07/11/2015), or as needed, for medicare wellness.

## 2014-07-11 NOTE — Addendum Note (Signed)
Addended by: Ria Bush on: 07/11/2014 09:03 AM   Modules accepted: Level of Service, SmartSet

## 2014-07-11 NOTE — Progress Notes (Signed)
Pre visit review using our clinic review tool, if applicable. No additional management support is needed unless otherwise documented below in the visit note. 

## 2014-07-11 NOTE — Assessment & Plan Note (Addendum)
Advanced directives: wife is HCPOA then son. Advanced directives in chart (07/2013). 

## 2014-07-25 NOTE — Discharge Summary (Signed)
PATIENT NAME:  Jason Cooper, Jason Cooper MR#:  [id number] DATE OF BIRTH:  September 22, 1945  DATE OF ADMISSION:  02/20/2013 DATE OF DISCHARGE:  02/22/2013   DICTATING FOR: Jeneen Rinks P. Holley Bouche., MD  ADMITTING DIAGNOSIS: Degenerative arthrosis of left knee.   DISCHARGE DIAGNOSIS: Degenerative arthrosis of left knee.    HISTORY: The patient is a 69 year old gentleman who has been followed at [facility] for progression of left knee pain. He had attempted to remain active, although the left knee pain had increased, significantly limiting his activities. He had noticed restriction in his range of motion. The patient localized most of the pain along the medial aspect of the knee. His pain was aggravated with weight-bearing activities. On occasion, the patient was noted to take over-the-counter anti-inflammatory medications with only minimal relief. The patient was not using any type of ambulatory aid at the time of surgery. He states that the pain had increased to the point that it was significantly interfering with his activities of daily living. X-rays taken in [facility] showed narrowing of the medial cartilage space with associated varus alignment. He was noted to have osteophyte as well as subchondral sclerosis. After discussion of the risks and benefits of surgical intervention, the patient expressed his understanding of the risks and benefits and agreed for plans for surgical intervention.   PROCEDURE: Left total knee arthroplasty using computer-assisted navigation.   ANESTHESIA: Spinal.   SOFT TISSUE RELEASE: Anterior cruciate ligament, posterior cruciate ligament, deep and superficial medial collateral ligaments as well as patellofemoral ligament.   IMPLANTS UTILIZED: DePuy Sigma size 5 posterior stabilized femoral component (cemented), size 6 MBT tibial component (cemented), a 41 mm 3-pegged oval dome patella (cemented), and a 10 mm stabilized rotating platform polyethylene insert. Gentamicin bone  cement was utilized due to the patient's history of diabetes.   HOSPITAL COURSE: The patient tolerated the procedure very well. He had no complications. He was then taken to the PACU, where he was stabilized and then transferred to the orthopedic floor. The patient began receiving anticoagulation therapy of Lovenox 30 mg subcutaneous q.12 hours per anesthesia and pharmacy protocol. He was fitted with TED stockings bilaterally. These were allowed to be removed 1 hour per 8-hour shift. The left one was applied on day 2 following removal of the Hemovac and dressing change. The patient was also fitted with AVI compression foot pumps set at 80 mmHg bilaterally. He has had no evidence of any DVTs. Negative Homans sign. Calves were nontender. Heels were elevated off the bed using rolled towels.   The patient's vital signs have been stable. He has been afebrile. Hemodynamically, he was stable. No transfusions were given other than the Autovac transfusion given the first 6 hours postoperatively. The patient denied any chest pains or shortness of breath during the hospital course.   Physical therapy was initiated on day 1 for gait training and transfers. Upon being discharged, was ambulating greater than 200 feet. He was able go up and down 4 sets of steps. He was independent with bed-to-chair transfers. Occupational therapy was also initiated on day 1 for ADLs and assistive devices.   The patient's IV, Foley and Hemovac were discontinued on day 2 along with the dressing change. The wound was free of any drainage or signs of infection. The Polar Care was reapplied to the surgical leg, maintaining a temperature of 40 to 50 degrees Fahrenheit.   DISPOSITION: The patient is being discharged to home in improved stable condition.   DISCHARGE INSTRUCTIONS:  1.  He may weight bear as tolerated. Continue using a walker until cleared by physical therapy to go to a quad cane.  2. He will receive home health PT.  3.  Continue TED stockings. These are to be worn during the day, but may be removed at night. 4. Continue with the Polar Care, maintaining a temperature of 40 to 50 degrees Fahrenheit.  5. Elevate the heels off the bed.  6. Incentive spirometer q.1 hour while awake. Encourage cough and deep breathing every 2 hours while awake.  7. He is placed on an ADA diet.  8. He is to call the clinic for any temperatures of 101.5 or greater or excessive bleeding.  9. The dressing is to be changed as needed.  10. He is not to take a shower until the staples are removed.  11. He has a followup appointment on December 4th at 9:15. He is to call the clinic sooner if any complications.   DRUG ALLERGIES: No known drug allergies.   PAST MEDICAL HISTORY: Chickenpox, diabetes, heart blockage, migraines.   ____________________________ Vance Peper, PA jrw:[id number] D: 02/22/2013 08:02:44 ET T: 02/22/2013 08:28:10 ET JOB#: [id number]  cc: Vance Peper, PA, <Dictator> Shontae Rosiles PA ELECTRONICALLY SIGNED 03/04/2013 22:00

## 2014-07-25 NOTE — Op Note (Signed)
PATIENT NAME:  Jason Cooper, Jason Cooper MR#:  [id number] DATE OF BIRTH:  Mar 30, 1946  DATE OF PROCEDURE:  02/20/2013  PREOPERATIVE DIAGNOSIS: Degenerative arthrosis of the left knee.   POSTOPERATIVE DIAGNOSIS: Degenerative arthrosis of the left knee.   PROCEDURE PERFORMED: Left total knee arthroplasty using computer-assisted navigation.   SURGEON: Skip Estimable, M.D.   ASSISTANT: Vance Peper, PA (required to maintain retraction throughout the procedure).   ANESTHESIA: Spinal.   ESTIMATED BLOOD LOSS: 150 mL.   FLUIDS REPLACED: 2500 mL of crystalloid.   TOURNIQUET TIME: 119 minutes.   DRAINS: Two medium drains to reinfusion system.   SOFT TISSUE RELEASES: Anterior cruciate ligament, posterior cruciate ligament, deep and superficial medial collateral ligament and patellofemoral ligament.   IMPLANTS UTILIZED: DePuy PFC Sigma size 5 posterior stabilized femoral component (cemented), size 6 MBT tibial component (cemented), 41 mm 3-peg oval dome patella (cemented), and a 10 mm stabilized rotating platform polyethylene insert. Gentamicin bone cement was utilized due to the patient's history of diabetes.   INDICATIONS FOR SURGERY: The patient is a 69 year old male, who has been seen for complaints of progressive left knee pain. X-rays demonstrated severe degenerative changes in tricompartmental fashion with varus deformity. After discussion of the risks and benefits of surgical intervention, the patient expressed understanding of the risks and benefits and agreed with plans for surgical intervention.   PROCEDURE IN DETAIL: The patient was brought in the operating room then, after adequate spinal anesthesia was achieved, a tourniquet was placed on the patient's upper left thigh. The patient's left knee and leg were cleaned and prepped with alcohol and DuraPrep and draped in the usual sterile fashion. A "timeout" was performed as per usual protocol. The left lower extremity was exsanguinated using an Esmarch,  and tourniquet was inflated to 300 mmHg. Anterior longitudinal incision was made followed by a standard mid vastus approach. A large effusion was evacuated. The deep fibers of the medial collateral ligament were elevated in a subperiosteal fashion off the medial flare of the tibia so as to maintain a continuous soft tissue sleeve. The patella was subluxed laterally and the patellofemoral ligament was incised. Inspection of the knee demonstrated severe degenerative changes in tricompartmental fashion with full-thickness loss of articular cartilage appreciated. Prominent osteophytes were debrided using a rongeur. Anterior and posterior cruciate ligaments were excised. Two 4.0 mm Schanz pins were inserted into the femur and into the tibia for attachment of the array of trackers used for computer-assisted navigation. Hip center was identified using circumduction technique. Distal landmarks were mapped using the computer. The distal femur and proximal tibia were mapped using the computer. Distal femoral cutting guide was positioned using computer-assisted navigation so as to achieve a 5 degree distal valgus cut. Cut was performed and verified using the computer. Distal femur was sized, and it was felt that a size 5 femoral component was appropriate. A size 5 cutting guide was positioned and the anterior cut was performed and verified using the computer. This was followed by completion of the posterior and chamfer cuts. Femoral cutting guide for central box was then positioned and the central box cut was performed.   Attention was then directed to the proximal tibia. Medial and lateral menisci were excised. The extramedullary tibial cutting guide was positioned using computer-assisted navigation so as to achieve a 0 degree varus valgus alignment and 0 degree posterior slope. Cut was performed and verified using the computer. The proximal tibia was sized and it was felt that a size 6 tibial tray was appropriate.  Tibial  and femoral trials were inserted followed by insertion of a 10 mm polyethylene insert. The knee was felt to be tight medially. A Cobb elevator was used to elevate the superficial fibers of the medial collateral ligament. This allowed for excellent mediolateral soft tissue balancing in flexion and extension. Posterior osteophytes were debrided and capsule was elevated off of the posterior aspect of the femur thus allowing for full extension noted during testing. Finally, the patella was cut and prepared so as to accommodate a 41 mm 3-peg oval dome patella. Patellar trial was placed and the knee was placed through a range of motion with excellent patellar tracking appreciated. The femoral trial was removed. Central post hole for the tibial component was reamed followed by insertion of a keel punch. Tibial trials were then removed. The cut surfaces of bone were irrigated with copious amounts of normal saline with antibiotic solution using pulsatile lavage and then suctioned dry. Polymethyl methacrylate cement with gentamicin was prepared in the usual fashion using a vacuum mixer. Cement was applied to the cut surface of the proximal tibia as well as along the undersurface of a size 6 MBT tibial component. The tibial component was positioned and impacted into place. Excess cement was removed using freer elevators. Cement was applied to the cut surface of the femur as well as along the posterior flanges of a size 5 posterior stabilized femoral component. Femoral component was positioned and impacted into place. Excess cement was removed using freer elevators. A 10 mm polyethylene trial was inserted and the knee was brought in full extension with steady axial compression applied. Finally, cement was applied to the backside of a 41 mm 3-peg oval dome patella, and the patellar component was positioned and patellar clamp applied. Excess cement was removed using freer elevators.   After adequate curing of the cement,  tourniquet was deflated after a total tourniquet time of 119 minutes. Hemostasis was achieved using electrocautery. The knee was irrigated with copious amounts of normal saline with antibiotic solution using pulsatile lavage and then suctioned dry. The knee was inspected for any residual cement debris. 20 mL of 1.3% Exparel in 40 mL of normal saline was injected along the posterior capsule, medial and lateral gutters, and along the arthrotomy site. A 10 mm stabilized rotating platform polyethylene insert was inserted and the knee was placed through a range of motion with excellent patellar tracking appreciated and excellent mediolateral soft tissue balancing noted. Two medium drains were placed in the wound bed and brought out through a separate stab incision to be attached to a reinfusion system. The medial parapatellar portion of the incision was reapproximated using interrupted sutures of #1 Vicryl. The subcutaneous tissue was approximated in layers using first #0 Vicryl followed by 2-0 Vicryl. The subcutaneous tissue along the incision site was then injected with 30 mL of 0.25% Marcaine with epinephrine. The skin was reapproximated with skin staples. A sterile dressing was applied. The patient tolerated the procedure well. He was transported to the recovery room in stable condition. ____________________________ Laurice Record. Holley Bouche., MD jph:aw D: 02/21/2013 06:39:05 ET T: 02/21/2013 06:54:02 ET JOB#: [id number]  cc: Laurice Record. Holley Bouche., MD, <Dictator> Laurice Record Holley Bouche MD ELECTRONICALLY SIGNED 02/21/2013 18:54

## 2014-12-19 DIAGNOSIS — Z23 Encounter for immunization: Secondary | ICD-10-CM | POA: Diagnosis not present

## 2015-07-08 ENCOUNTER — Other Ambulatory Visit: Payer: Self-pay | Admitting: Family Medicine

## 2015-07-08 DIAGNOSIS — I10 Essential (primary) hypertension: Secondary | ICD-10-CM

## 2015-07-08 DIAGNOSIS — R7303 Prediabetes: Secondary | ICD-10-CM

## 2015-07-08 DIAGNOSIS — D649 Anemia, unspecified: Secondary | ICD-10-CM

## 2015-07-08 DIAGNOSIS — Z1159 Encounter for screening for other viral diseases: Secondary | ICD-10-CM

## 2015-07-08 DIAGNOSIS — Z125 Encounter for screening for malignant neoplasm of prostate: Secondary | ICD-10-CM

## 2015-07-08 DIAGNOSIS — E785 Hyperlipidemia, unspecified: Secondary | ICD-10-CM

## 2015-07-10 ENCOUNTER — Other Ambulatory Visit: Payer: Self-pay | Admitting: Family Medicine

## 2015-07-10 ENCOUNTER — Other Ambulatory Visit (INDEPENDENT_AMBULATORY_CARE_PROVIDER_SITE_OTHER): Payer: Medicare Other

## 2015-07-10 DIAGNOSIS — I10 Essential (primary) hypertension: Secondary | ICD-10-CM | POA: Diagnosis not present

## 2015-07-10 DIAGNOSIS — Z1159 Encounter for screening for other viral diseases: Secondary | ICD-10-CM | POA: Diagnosis not present

## 2015-07-10 DIAGNOSIS — E785 Hyperlipidemia, unspecified: Secondary | ICD-10-CM | POA: Diagnosis not present

## 2015-07-10 DIAGNOSIS — Z125 Encounter for screening for malignant neoplasm of prostate: Secondary | ICD-10-CM

## 2015-07-10 DIAGNOSIS — R7303 Prediabetes: Secondary | ICD-10-CM

## 2015-07-10 DIAGNOSIS — D649 Anemia, unspecified: Secondary | ICD-10-CM

## 2015-07-10 LAB — CBC WITH DIFFERENTIAL/PLATELET
Basophils Absolute: 0 10*3/uL (ref 0.0–0.1)
Basophils Relative: 0.9 % (ref 0.0–3.0)
EOS PCT: 6 % — AB (ref 0.0–5.0)
Eosinophils Absolute: 0.3 10*3/uL (ref 0.0–0.7)
HEMATOCRIT: 41.2 % (ref 39.0–52.0)
HEMOGLOBIN: 14.1 g/dL (ref 13.0–17.0)
LYMPHS PCT: 26.2 % (ref 12.0–46.0)
Lymphs Abs: 1.2 10*3/uL (ref 0.7–4.0)
MCHC: 34.1 g/dL (ref 30.0–36.0)
MCV: 90.2 fl (ref 78.0–100.0)
MONO ABS: 0.5 10*3/uL (ref 0.1–1.0)
Monocytes Relative: 10.4 % (ref 3.0–12.0)
Neutro Abs: 2.6 10*3/uL (ref 1.4–7.7)
Neutrophils Relative %: 56.5 % (ref 43.0–77.0)
Platelets: 239 10*3/uL (ref 150.0–400.0)
RBC: 4.57 Mil/uL (ref 4.22–5.81)
RDW: 13.8 % (ref 11.5–15.5)
WBC: 4.5 10*3/uL (ref 4.0–10.5)

## 2015-07-10 LAB — HEMOGLOBIN A1C: Hgb A1c MFr Bld: 6.4 % (ref 4.6–6.5)

## 2015-07-10 LAB — IBC PANEL
Iron: 72 ug/dL (ref 42–165)
SATURATION RATIOS: 18.4 % — AB (ref 20.0–50.0)
TRANSFERRIN: 280 mg/dL (ref 212.0–360.0)

## 2015-07-10 LAB — BASIC METABOLIC PANEL
BUN: 18 mg/dL (ref 6–23)
CHLORIDE: 102 meq/L (ref 96–112)
CO2: 30 mEq/L (ref 19–32)
Calcium: 10.2 mg/dL (ref 8.4–10.5)
Creatinine, Ser: 0.95 mg/dL (ref 0.40–1.50)
GFR: 83.59 mL/min (ref >60.00)
Glucose, Bld: 114 mg/dL — ABNORMAL HIGH (ref 70–99)
POTASSIUM: 4.8 meq/L (ref 3.5–5.1)
Sodium: 138 mEq/L (ref 135–145)

## 2015-07-10 LAB — LIPID PANEL
Cholesterol: 145 mg/dL (ref 0–200)
HDL: 39.6 mg/dL (ref >39.00)
LDL CALC: 90 mg/dL (ref 0–99)
NONHDL: 105.18
Total CHOL/HDL Ratio: 4
Triglycerides: 76 mg/dL (ref 0.0–149.0)
VLDL: 15.2 mg/dL (ref 0.0–40.0)

## 2015-07-10 LAB — VITAMIN B12: VITAMIN B 12: 340 pg/mL (ref 211–911)

## 2015-07-10 LAB — HEPATITIS C ANTIBODY: HCV Ab: NEGATIVE

## 2015-07-10 LAB — TSH: TSH: 0.24 u[IU]/mL — ABNORMAL LOW (ref 0.35–4.50)

## 2015-07-10 LAB — FOLATE

## 2015-07-10 LAB — PSA, MEDICARE: PSA: 3.96 ng/ml (ref 0.10–4.00)

## 2015-07-10 LAB — FERRITIN: FERRITIN: 57.2 ng/mL (ref 22.0–322.0)

## 2015-07-15 ENCOUNTER — Ambulatory Visit (INDEPENDENT_AMBULATORY_CARE_PROVIDER_SITE_OTHER): Payer: Medicare Other | Admitting: Family Medicine

## 2015-07-15 ENCOUNTER — Encounter: Payer: Self-pay | Admitting: Family Medicine

## 2015-07-15 VITALS — BP 108/68 | HR 80 | Temp 98.2°F | Ht 74.0 in | Wt 268.2 lb

## 2015-07-15 DIAGNOSIS — I10 Essential (primary) hypertension: Secondary | ICD-10-CM

## 2015-07-15 DIAGNOSIS — Z7189 Other specified counseling: Secondary | ICD-10-CM | POA: Diagnosis not present

## 2015-07-15 DIAGNOSIS — R1314 Dysphagia, pharyngoesophageal phase: Secondary | ICD-10-CM

## 2015-07-15 DIAGNOSIS — R1319 Other dysphagia: Secondary | ICD-10-CM

## 2015-07-15 DIAGNOSIS — R946 Abnormal results of thyroid function studies: Secondary | ICD-10-CM | POA: Diagnosis not present

## 2015-07-15 DIAGNOSIS — E785 Hyperlipidemia, unspecified: Secondary | ICD-10-CM

## 2015-07-15 DIAGNOSIS — R1310 Dysphagia, unspecified: Secondary | ICD-10-CM

## 2015-07-15 DIAGNOSIS — R7989 Other specified abnormal findings of blood chemistry: Secondary | ICD-10-CM

## 2015-07-15 DIAGNOSIS — R7303 Prediabetes: Secondary | ICD-10-CM

## 2015-07-15 DIAGNOSIS — Z0000 Encounter for general adult medical examination without abnormal findings: Secondary | ICD-10-CM | POA: Diagnosis not present

## 2015-07-15 DIAGNOSIS — E0590 Thyrotoxicosis, unspecified without thyrotoxic crisis or storm: Secondary | ICD-10-CM | POA: Insufficient documentation

## 2015-07-15 LAB — TSH: TSH: 0.26 u[IU]/mL — AB (ref 0.35–4.50)

## 2015-07-15 LAB — T4, FREE: Free T4: 1.1 ng/dL (ref 0.60–1.60)

## 2015-07-15 NOTE — Assessment & Plan Note (Signed)
Chronic, stable. Continue current regimen. Pt desires continued combo medication

## 2015-07-15 NOTE — Assessment & Plan Note (Signed)

## 2015-07-15 NOTE — Progress Notes (Signed)
BP 108/68 mmHg  Pulse 80  Temp(Src) 98.2 F (36.8 C) (Oral)  Ht 6\' 2"  (1.88 m)  Wt 268 lb 4 oz (121.677 kg)  BMI 34.43 kg/m2  SpO2 96%   CC: medicare wellness visit  Subjective:    Patient ID: CAROLYN CHUANG, male    DOB: Oct 21, 1945, 70 y.o.   MRN: [id number]  HPI: LATRAVIOUS SUBASIC is a 70 y.o. male presenting on 07/15/2015 for Medicare Wellness   New dysphagia to solids and liquids over last 3 months. Happens daily, with meals. Feels esophagus closes up, painful. Has tried smaller quantities of food as well as slowed eating. Doesn't drink large amounts of fluids. Has had to leave table to vomit which resolves problem. Endorses early satiety. Denies fevers/chills, GERD sxs, or unexpected weight loss (8lb weight loss noted, trying). Takes aleve rarely. Not on PPI.   TSH low today - mother with h/o thyroid removal 30 yrs ago. No personal history of thyroid disease. No seaweed ingestion, no h/o radiation therapy. Denies hyperthyroid symptoms  Hearing screen passed Eye exam at [facility] Denies falls/depression or anhedonia.  Preventative: Colonoscopy 2008 WNL, rpt 10 yrs [name])  Prostate - requests PSA yearly, declines DRE this year - very stable in the past.  Flu shot - yearly Tetanus 2011  Pneumovax 2011 and 2017, prevnar 07/2013 Shingles shot - 2008  Advanced directives: wife is HCPOA then son. Advanced directives in chart (07/2013). Seat belt use discussed Sunscreen use discussed. No changing moles on skin.   Lives with wife, no pets  Occupation: retired Engineer, structural  Edu: graduate degree  Activity: enjoys traveling, walking 2 mi/day  Diet: good water, fruits/vegetables daily, some fish   Relevant past medical, surgical, family and social history reviewed and updated as indicated. Interim medical history since our last visit reviewed. Allergies and medications reviewed and updated. Current Outpatient Prescriptions on File Prior to Visit  Medication Sig  . aspirin EC  81 MG tablet Take 81 mg by mouth daily.  . Cholecalciferol (VITAMIN D-3) 1000 UNITS CAPS Take 1 capsule by mouth daily.  Marland Kitchen lisinopril-hydrochlorothiazide (PRINZIDE,ZESTORETIC) 20-12.5 MG per tablet Take 1 tablet by mouth daily.  Marland Kitchen loratadine (CLARITIN) 10 MG tablet Take 10 mg by mouth daily as needed for allergies.  . Multiple Vitamin (MULTIVITAMIN) tablet Take 1 tablet by mouth daily.  . naproxen sodium (ANAPROX) 220 MG tablet Take 220 mg by mouth daily as needed.   . simvastatin (ZOCOR) 40 MG tablet Take 40 mg by mouth daily.   No current facility-administered medications on file prior to visit.    Review of Systems Per HPI unless specifically indicated in ROS section     Objective:    BP 108/68 mmHg  Pulse 80  Temp(Src) 98.2 F (36.8 C) (Oral)  Ht 6\' 2"  (1.88 m)  Wt 268 lb 4 oz (121.677 kg)  BMI 34.43 kg/m2  SpO2 96%  Wt Readings from Last 3 Encounters:  07/15/15 268 lb 4 oz (121.677 kg)  07/11/14 276 lb (125.193 kg)  04/16/13 264 lb 8 oz (119.976 kg)    Physical Exam  Constitutional: He is oriented to person, place, and time. He appears well-developed and well-nourished. No distress.  HENT:  Head: Normocephalic and atraumatic.  Right Ear: Hearing, tympanic membrane, external ear and ear canal normal.  Left Ear: Hearing, tympanic membrane, external ear and ear canal normal.  Nose: Nose normal.  Mouth/Throat: Uvula is midline, oropharynx is clear and moist and mucous membranes are normal. No  oropharyngeal exudate, posterior oropharyngeal edema or posterior oropharyngeal erythema.  Eyes: Conjunctivae and EOM are normal. Pupils are equal, round, and reactive to light. No scleral icterus.  Neck: Normal range of motion. Neck supple. Carotid bruit is not present. No thyromegaly present.  Cardiovascular: Normal rate, regular rhythm, normal heart sounds and intact distal pulses.   No murmur heard. Pulses:      Radial pulses are 2+ on the right side, and 2+ on the left side.    Pulmonary/Chest: Effort normal and breath sounds normal. No respiratory distress. He has no wheezes. He has no rales.  Abdominal: Soft. Bowel sounds are normal. He exhibits no distension and no mass. There is no tenderness. There is no rebound and no guarding.  Genitourinary:  DRE deferred  Musculoskeletal: Normal range of motion. He exhibits no edema.  Lymphadenopathy:    He has no cervical adenopathy.  Neurological: He is alert and oriented to person, place, and time.  CN grossly intact, station and gait intact Recall 3/3 Calculation 4/5 serial 3s  Skin: Skin is warm and dry. No rash noted.  Psychiatric: He has a normal mood and affect. His behavior is normal. Judgment and thought content normal.  Nursing note and vitals reviewed.  Results for orders placed or performed in visit on 07/10/15  Hepatitis C antibody  Result Value Ref Range   HCV Ab NEGATIVE NEGATIVE      Assessment & Plan:   Problem List Items Addressed This Visit    HTN (hypertension)    Chronic, stable. Continue current regimen. Pt desires continued combo medication      HLD (hyperlipidemia)    Chronic, stable. Continue current regimen.      Prediabetes    Encouraged decreased added sugars, sweetened beverages and simple carbs. Recheck A1c in 6 months.      Medicare annual wellness visit, subsequent - Primary    I have personally reviewed the Medicare Annual Wellness questionnaire and have noted 1. The patient's medical and social history 2. Their use of alcohol, tobacco or illicit drugs 3. Their current medications and supplements 4. The patient's functional ability including ADL's, fall risks, home safety risks and hearing or visual impairment. Cognitive function has been assessed and addressed as indicated.  5. Diet and physical activity 6. Evidence for depression or mood disorders The patients weight, height, BMI have been recorded in the chart. I have made referrals, counseling and provided  education to the patient based on review of the above and I have provided the pt with a written personalized care plan for preventive services. Provider list updated.. See scanned questionairre as needed for further documentation. Reviewed preventative protocols and updated unless pt declined.       Advanced care planning/counseling discussion    Advanced directives: wife is HCPOA then son. Advanced directives in chart (07/2013).      Low TSH level    No thyromegaly or nodule appreciated today Recheck TFTs today and if persistent evidence of overactive thyroid discussed endo referral. Pt agrees. Overall asxs. +fmhx thyroid disease, no cancer.      Relevant Orders   TSH   T3   T4, free   Esophageal dysphagia    New over last 3 months - refer to GI to discuss EGD vs further management.      Relevant Orders   Ambulatory referral to Gastroenterology       Follow up plan: Return in about 1 year (around 07/14/2016) for medicare wellness visit.  Ria Bush,  MD

## 2015-07-15 NOTE — Assessment & Plan Note (Signed)
Advanced directives: wife is HCPOA then son. Advanced directives in chart (07/2013). 

## 2015-07-15 NOTE — Assessment & Plan Note (Signed)
No thyromegaly or nodule appreciated today Recheck TFTs today and if persistent evidence of overactive thyroid discussed endo referral. Pt agrees. Overall asxs. +fmhx thyroid disease, no cancer.

## 2015-07-15 NOTE — Assessment & Plan Note (Signed)
New over last 3 months - refer to GI to discuss EGD vs further management.

## 2015-07-15 NOTE — Progress Notes (Signed)
Pre visit review using our clinic review tool, if applicable. No additional management support is needed unless otherwise documented below in the visit note. 

## 2015-07-15 NOTE — Assessment & Plan Note (Signed)
Chronic, stable. Continue current regimen. 

## 2015-07-15 NOTE — Assessment & Plan Note (Signed)
Encouraged decreased added sugars, sweetened beverages and simple carbs. Recheck A1c in 6 months.

## 2015-07-15 NOTE — Patient Instructions (Addendum)
Labwork today. Pass by Rosaria Ferries or [place] office for GI referral for trouble swallowing. Good to see you today, call us with quesitons. Watch added sugars, simple carbs in diet. Return for labs only in 6 months to recheck sugar levels. Return in 1 year for next wellnes visit.   Health Maintenance, Male A healthy lifestyle and preventative care can promote health and wellness.  Maintain regular health, dental, and eye exams.  Eat a healthy diet. Foods like vegetables, fruits, whole grains, low-fat dairy products, and lean protein foods contain the nutrients you need and are low in calories. Decrease your intake of foods high in solid fats, added sugars, and salt. Get information about a proper diet from your health care provider, if necessary.  Regular physical exercise is one of the most important things you can do for your health. Most adults should get at least 150 minutes of moderate-intensity exercise (any activity that increases your heart rate and causes you to sweat) each week. In addition, most adults need muscle-strengthening exercises on 2 or more days a week.   Maintain a healthy weight. The body mass index (BMI) is a screening tool to identify possible weight problems. It provides an estimate of body fat based on height and weight. Your health care provider can find your BMI and can help you achieve or maintain a healthy weight. For males 20 years and older:  A BMI below 18.5 is considered underweight.  A BMI of 18.5 to 24.9 is normal.  A BMI of 25 to 29.9 is considered overweight.  A BMI of 30 and above is considered obese.  Maintain normal blood lipids and cholesterol by exercising and minimizing your intake of saturated fat. Eat a balanced diet with plenty of fruits and vegetables. Blood tests for lipids and cholesterol should begin at age 3 and be repeated every 5 years. If your lipid or cholesterol levels are high, you are over age 2, or you are at high risk for heart  disease, you may need your cholesterol levels checked more frequently.Ongoing high lipid and cholesterol levels should be treated with medicines if diet and exercise are not working.  If you smoke, find out from your health care provider how to quit. If you do not use tobacco, do not start.  Lung cancer screening is recommended for adults aged 70-80 years who are at high risk for developing lung cancer because of a history of smoking. A yearly low-dose CT scan of the lungs is recommended for people who have at least a 30-pack-year history of smoking and are current smokers or have quit within the past 15 years. A pack year of smoking is smoking an average of 1 pack of cigarettes a day for 1 year (for example, a 30-pack-year history of smoking could mean smoking 1 pack a day for 30 years or 2 packs a day for 15 years). Yearly screening should continue until the smoker has stopped smoking for at least 15 years. Yearly screening should be stopped for people who develop a health problem that would prevent them from having lung cancer treatment.  If you choose to drink alcohol, do not have more than 2 drinks per day. One drink is considered to be 12 oz (360 mL) of beer, 5 oz (150 mL) of wine, or 1.5 oz (45 mL) of liquor.  Avoid the use of street drugs. Do not share needles with anyone. Ask for help if you need support or instructions about stopping the use of drugs.  High blood pressure causes heart disease and increases the risk of stroke. High blood pressure is more likely to develop in:  People who have blood pressure in the end of the normal range (100-139/85-89 mm Hg).  People who are overweight or obese.  People who are African American.  If you are 31-71 years of age, have your blood pressure checked every 3-5 years. If you are 35 years of age or older, have your blood pressure checked every year. You should have your blood pressure measured twice--once when you are at a hospital or clinic, and  once when you are not at a hospital or clinic. Record the average of the two measurements. To check your blood pressure when you are not at a hospital or clinic, you can use:  An automated blood pressure machine at a pharmacy.  A home blood pressure monitor.  If you are 36-48 years old, ask your health care provider if you should take aspirin to prevent heart disease.  Diabetes screening involves taking a blood sample to check your fasting blood sugar level. This should be done once every 3 years after age 58 if you are at a normal weight and without risk factors for diabetes. Testing should be considered at a younger age or be carried out more frequently if you are overweight and have at least 1 risk factor for diabetes.  Colorectal cancer can be detected and often prevented. Most routine colorectal cancer screening begins at the age of 49 and continues through age 46. However, your health care provider may recommend screening at an earlier age if you have risk factors for colon cancer. On a yearly basis, your health care provider may provide home test kits to check for hidden blood in the stool. A small camera at the end of a tube may be used to directly examine the colon (sigmoidoscopy or colonoscopy) to detect the earliest forms of colorectal cancer. Talk to your health care provider about this at age 71 when routine screening begins. A direct exam of the colon should be repeated every 5-10 years through age 49, unless early forms of precancerous polyps or small growths are found.  People who are at an increased risk for hepatitis B should be screened for this virus. You are considered at high risk for hepatitis B if:  You were born in a country where hepatitis B occurs often. Talk with your health care provider about which countries are considered high risk.  Your parents were born in a high-risk country and you have not received a shot to protect against hepatitis B (hepatitis B  vaccine).  You have HIV or AIDS.  You use needles to inject street drugs.  You live with, or have sex with, someone who has hepatitis B.  You are a man who has sex with other men (MSM).  You get hemodialysis treatment.  You take certain medicines for conditions like cancer, organ transplantation, and autoimmune conditions.  Hepatitis C blood testing is recommended for all people born from [birth year] through 1964 and any individual with known risk factors for hepatitis C.  Healthy men should no longer receive prostate-specific antigen (PSA) blood tests as part of routine cancer screening. Talk to your health care provider about prostate cancer screening.  Testicular cancer screening is not recommended for adolescents or adult males who have no symptoms. Screening includes self-exam, a health care provider exam, and other screening tests. Consult with your health care provider about any symptoms you have or any  concerns you have about testicular cancer.  Practice safe sex. Use condoms and avoid high-risk sexual practices to reduce the spread of sexually transmitted infections (STIs).  You should be screened for STIs, including gonorrhea and chlamydia if:  You are sexually active and are younger than 24 years.  You are older than 24 years, and your health care provider tells you that you are at risk for this type of infection.  Your sexual activity has changed since you were last screened, and you are at an increased risk for chlamydia or gonorrhea. Ask your health care provider if you are at risk.  If you are at risk of being infected with HIV, it is recommended that you take a prescription medicine daily to prevent HIV infection. This is called pre-exposure prophylaxis (PrEP). You are considered at risk if:  You are a man who has sex with other men (MSM).  You are a heterosexual man who is sexually active with multiple partners.  You take drugs by injection.  You are sexually active  with a partner who has HIV.  Talk with your health care provider about whether you are at high risk of being infected with HIV. If you choose to begin PrEP, you should first be tested for HIV. You should then be tested every 3 months for as long as you are taking PrEP.  Use sunscreen. Apply sunscreen liberally and repeatedly throughout the day. You should seek shade when your shadow is shorter than you. Protect yourself by wearing long sleeves, pants, a wide-brimmed hat, and sunglasses year round whenever you are outdoors.  Tell your health care provider of new moles or changes in moles, especially if there is a change in shape or color. Also, tell your health care provider if a mole is larger than the size of a pencil eraser.  A one-time screening for abdominal aortic aneurysm (AAA) and surgical repair of large AAAs by ultrasound is recommended for men aged 18-75 years who are current or former smokers.  Stay current with your vaccines (immunizations).   This information is not intended to replace advice given to you by your health care provider. Make sure you discuss any questions you have with your health care provider.   Document Released: 09/17/2007 Document Revised: 04/11/2014 Document Reviewed: 08/16/2010 Elsevier Interactive Patient Education [employer].

## 2015-07-16 LAB — T3: T3, Total: 96 ng/dL (ref 76–181)

## 2015-07-22 ENCOUNTER — Other Ambulatory Visit: Payer: Self-pay | Admitting: Family Medicine

## 2015-07-22 DIAGNOSIS — E039 Hypothyroidism, unspecified: Secondary | ICD-10-CM

## 2015-07-22 DIAGNOSIS — E038 Other specified hypothyroidism: Secondary | ICD-10-CM

## 2015-07-27 ENCOUNTER — Other Ambulatory Visit: Payer: Self-pay | Admitting: Physician Assistant

## 2015-07-27 DIAGNOSIS — R1319 Other dysphagia: Secondary | ICD-10-CM | POA: Diagnosis not present

## 2015-07-30 ENCOUNTER — Ambulatory Visit
Admission: RE | Admit: 2015-07-30 | Discharge: 2015-07-30 | Disposition: A | Discharge status: Home / Self Care | Payer: Medicare Other | Source: Ambulatory Visit | Attending: Physician Assistant | Admitting: Physician Assistant

## 2015-07-30 DIAGNOSIS — R938 Abnormal findings on diagnostic imaging of other specified body structures: Secondary | ICD-10-CM | POA: Insufficient documentation

## 2015-07-30 DIAGNOSIS — R1319 Other dysphagia: Secondary | ICD-10-CM | POA: Insufficient documentation

## 2015-07-30 DIAGNOSIS — R1310 Dysphagia, unspecified: Secondary | ICD-10-CM | POA: Diagnosis not present

## 2015-09-03 HISTORY — PX: PORTACATH PLACEMENT: SHX2246

## 2015-09-08 ENCOUNTER — Encounter: Payer: Self-pay | Admitting: *Deleted

## 2015-09-09 ENCOUNTER — Ambulatory Visit
Admission: RE | Admit: 2015-09-09 | Discharge: 2015-09-09 | Disposition: A | Discharge status: Home / Self Care | Payer: Medicare Other | Source: Ambulatory Visit | Attending: Unknown Physician Specialty | Admitting: Unknown Physician Specialty

## 2015-09-09 ENCOUNTER — Encounter
Admission: RE | Disposition: A | Discharge status: Home / Self Care | Payer: Self-pay | Source: Ambulatory Visit | Attending: Unknown Physician Specialty

## 2015-09-09 ENCOUNTER — Ambulatory Visit: Payer: Medicare Other | Admitting: Anesthesiology

## 2015-09-09 DIAGNOSIS — K3189 Other diseases of stomach and duodenum: Secondary | ICD-10-CM | POA: Diagnosis not present

## 2015-09-09 DIAGNOSIS — Z87891 Personal history of nicotine dependence: Secondary | ICD-10-CM | POA: Diagnosis not present

## 2015-09-09 DIAGNOSIS — I2510 Atherosclerotic heart disease of native coronary artery without angina pectoris: Secondary | ICD-10-CM | POA: Diagnosis not present

## 2015-09-09 DIAGNOSIS — K219 Gastro-esophageal reflux disease without esophagitis: Secondary | ICD-10-CM | POA: Insufficient documentation

## 2015-09-09 DIAGNOSIS — R1310 Dysphagia, unspecified: Secondary | ICD-10-CM | POA: Diagnosis not present

## 2015-09-09 DIAGNOSIS — E119 Type 2 diabetes mellitus without complications: Secondary | ICD-10-CM | POA: Diagnosis not present

## 2015-09-09 DIAGNOSIS — I10 Essential (primary) hypertension: Secondary | ICD-10-CM | POA: Diagnosis not present

## 2015-09-09 DIAGNOSIS — E039 Hypothyroidism, unspecified: Secondary | ICD-10-CM | POA: Diagnosis not present

## 2015-09-09 DIAGNOSIS — C160 Malignant neoplasm of cardia: Secondary | ICD-10-CM | POA: Diagnosis not present

## 2015-09-09 DIAGNOSIS — Z7982 Long term (current) use of aspirin: Secondary | ICD-10-CM | POA: Insufficient documentation

## 2015-09-09 DIAGNOSIS — Z79899 Other long term (current) drug therapy: Secondary | ICD-10-CM | POA: Diagnosis not present

## 2015-09-09 DIAGNOSIS — M1990 Unspecified osteoarthritis, unspecified site: Secondary | ICD-10-CM | POA: Insufficient documentation

## 2015-09-09 DIAGNOSIS — R933 Abnormal findings on diagnostic imaging of other parts of digestive tract: Secondary | ICD-10-CM | POA: Diagnosis not present

## 2015-09-09 DIAGNOSIS — Z96653 Presence of artificial knee joint, bilateral: Secondary | ICD-10-CM | POA: Insufficient documentation

## 2015-09-09 DIAGNOSIS — E785 Hyperlipidemia, unspecified: Secondary | ICD-10-CM | POA: Insufficient documentation

## 2015-09-09 HISTORY — DX: Other allergy status, other than to drugs and biological substances: Z91.09

## 2015-09-09 HISTORY — DX: Atherosclerotic heart disease of native coronary artery without angina pectoris: I25.10

## 2015-09-09 HISTORY — DX: Personal history of other infectious and parasitic diseases: Z86.19

## 2015-09-09 HISTORY — PX: ESOPHAGOGASTRODUODENOSCOPY (EGD) WITH PROPOFOL: SHX5813

## 2015-09-09 SURGERY — ESOPHAGOGASTRODUODENOSCOPY (EGD) WITH PROPOFOL
Anesthesia: General

## 2015-09-09 MED ORDER — GLYCOPYRROLATE 0.2 MG/ML IJ SOLN
INTRAMUSCULAR | Status: DC | PRN
  Administered 2015-09-09: 0.2 mg via INTRAVENOUS

## 2015-09-09 MED ORDER — SODIUM CHLORIDE 0.9 % IV SOLN
INTRAVENOUS | Status: DC
  Administered 2015-09-09: 1000 mL via INTRAVENOUS

## 2015-09-09 MED ORDER — PROPOFOL 10 MG/ML IV BOLUS
INTRAVENOUS | Status: DC | PRN
  Administered 2015-09-09: 20 mg via INTRAVENOUS
  Administered 2015-09-09: 50 mg via INTRAVENOUS
  Administered 2015-09-09 (×3): 20 mg via INTRAVENOUS
  Administered 2015-09-09 (×2): 50 mg via INTRAVENOUS
  Administered 2015-09-09 (×2): 20 mg via INTRAVENOUS

## 2015-09-09 MED ORDER — BUTAMBEN-TETRACAINE-BENZOCAINE 2-2-14 % EX AERO
INHALATION_SPRAY | CUTANEOUS | Status: DC | PRN
  Administered 2015-09-09: 1 via TOPICAL

## 2015-09-09 MED ORDER — SODIUM CHLORIDE 0.9 % IV SOLN: INTRAVENOUS | Status: DC

## 2015-09-09 MED ORDER — LIDOCAINE HCL (CARDIAC) 20 MG/ML IV SOLN
INTRAVENOUS | Status: DC | PRN
  Administered 2015-09-09: 80 mg via INTRAVENOUS

## 2015-09-09 MED ORDER — FENTANYL CITRATE (PF) 100 MCG/2ML IJ SOLN
INTRAMUSCULAR | Status: DC | PRN
  Administered 2015-09-09: 50 ug via INTRAVENOUS

## 2015-09-09 NOTE — Transfer of Care (Signed)
Immediate Anesthesia Transfer of Care Note  Patient: Jason Cooper  Procedure(s) Performed: Procedure(s): ESOPHAGOGASTRODUODENOSCOPY (EGD) WITH PROPOFOL (N/A)  Patient Location: PACU  Anesthesia Type:General  Level of Consciousness: awake, alert , oriented and patient cooperative  Airway & Oxygen Therapy: Patient Spontanous Breathing and Patient connected to nasal cannula oxygen  Post-op Assessment: Report given to RN, Post -op Vital signs reviewed and stable and Patient moving all extremities  Post vital signs: Reviewed and stable  Last Vitals:  Filed Vitals:   09/09/15 1002 09/09/15 1103  BP: 90/62 101/69  Pulse: 91 101  Temp: 36.6 C 35.8 C  Resp: 17 20    Last Pain: There were no vitals filed for this visit.       Complications: No apparent anesthesia complications

## 2015-09-09 NOTE — H&P (Signed)
Primary Care Physician:  Ria Bush, MD Primary Gastroenterologist:  Dr. Vira Agar  Pre-Procedure History & Physical: HPI:  Jason Cooper is a 70 y.o. male is here for an endoscopy.   Past Medical History  Diagnosis Date  . Osteoarthritis     s/p bilat knee replacements  . History of chicken pox   . Prediabetes 2007  . Seasonal allergic rhinitis   . HTN (hypertension)   . HLD (hyperlipidemia)   . Cluster headache     as 70 yo, no more since retired (25 yrs ago)  . H/O malaria 1969    during [place] (hospitalized in [place])  . Coronary artery disease   . Diabetes mellitus without complication ([name])   . Environmental allergies   . H/O varicella   . Malaria     Past Surgical History  Procedure Laterality Date  . Total knee arthroplasty Bilateral 2003, 2014    right-2003; left-2014  . Ankle surgery Left 11-1999    hag[place] deformity [name])  . Colonoscopy  2008    WNL, rec rpt 10 yrs [name])  . Joint replacement Right 02/2002    Prior to Admission medications   Medication Sig Start Date End Date Taking? Authorizing Provider  benazepril-hydrochlorthiazide (LOTENSIN HCT) 20-12.5 MG tablet Take 1 tablet by mouth daily.   Yes Historical Provider, MD  cetirizine (ZYRTEC) 10 MG tablet Take 10 mg by mouth daily.   Yes Historical Provider, MD  cyanocobalamin 2000 MCG tablet Take 2,000 mcg by mouth daily.   Yes Historical Provider, MD  ibuprofen (ADVIL,MOTRIN) 200 MG tablet Take 200 mg by mouth every 6 (six) hours as needed.   Yes Historical Provider, MD  aspirin EC 81 MG tablet Take 81 mg by mouth daily.    Historical Provider, MD  Cholecalciferol (VITAMIN D-3) 1000 UNITS CAPS Take 1 capsule by mouth daily.    Historical Provider, MD  lisinopril-hydrochlorothiazide (PRINZIDE,ZESTORETIC) 20-12.5 MG per tablet Take 1 tablet by mouth daily.    Historical Provider, MD  loratadine (CLARITIN) 10 MG tablet Take 10 mg by mouth daily as needed for allergies.    Historical  Provider, MD  Multiple Vitamin (MULTIVITAMIN) tablet Take 1 tablet by mouth daily.    Historical Provider, MD  naproxen sodium (ANAPROX) 220 MG tablet Take 220 mg by mouth daily as needed.     Historical Provider, MD  simvastatin (ZOCOR) 40 MG tablet Take 40 mg by mouth daily.    Historical Provider, MD    Allergies as of 08/27/2015  . (No Known Allergies)    Family History  Problem Relation Age of Onset  . Hypertension Mother   . Stroke Mother   . Stroke Other     maternal and paternal grandparents  . Hypertension Father   . Diabetes Father   . Cancer Paternal Aunt     bone  . Cancer Paternal Aunt     pancreas  . Cancer Cousin     prostate  . CAD Neg Hx   . Thyroid disease Mother     needed it removed, unsure why    Social History   Social History  . Marital Status: Married    Spouse Name: N/A  . Number of Children: N/A  . Years of Education: N/A   Occupational History  . Not on file.   Social History Main Topics  . Smoking status: Former Smoker    Types: Cigarettes    Quit date: 06/02/1997  . Smokeless tobacco: Never Used  .  Alcohol Use: 0.0 oz/week    0 Standard drinks or equivalent per week  . Drug Use: No  . Sexual Activity: Not on file   Other Topics Concern  . Not on file   Social History Narrative   Lives with wife, no pets   Occupation: retired Engineer, structural   Edu: graduate degree   Activity: enjoys traveling, rides stationary bike every morning 30 min, walking 2 mi/day, goes to [employer]   Diet: good water, fruits/vegetables daily    Review of Systems: See HPI, otherwise negative ROS  Physical Exam: BP 90/62 mmHg  Pulse 91  Temp(Src) 97.8 F (36.6 C) (Tympanic)  Resp 17  Ht 6' 2.5" (1.892 m)  Wt 117.935 kg (260 lb)  BMI 32.95 kg/m2  SpO2 97% General:   Alert,  pleasant and cooperative in NAD Head:  Normocephalic and atraumatic. Neck:  Supple; no masses or thyromegaly. Lungs:  Clear throughout to auscultation.    Heart:  Regular rate and  rhythm. Abdomen:  Soft, nontender and nondistended. Normal bowel sounds, without guarding, and without rebound.   Neurologic:  Alert and  oriented x4;  grossly normal neurologically.  Impression/Plan: Jason Cooper is here for an endoscopy to be performed for dysphagia  Risks, benefits, limitations, and alternatives regarding  endoscopy have been reviewed with the patient.  Questions have been answered.  All parties agreeable.   [name], MD  09/09/2015, 10:38 AM

## 2015-09-09 NOTE — Anesthesia Preprocedure Evaluation (Signed)
Anesthesia Evaluation  Patient identified by MRN, date of birth, ID band Patient awake    Reviewed: Allergy & Precautions, H&P , NPO status , Patient's Chart, lab work & pertinent test results, reviewed documented beta blocker date and time   History of Anesthesia Complications Negative for: history of anesthetic complications  Airway Mallampati: III  TM Distance: >3 FB Neck ROM: full    Dental  (+) Caps, Teeth Intact   Pulmonary neg pulmonary ROS, former smoker,    Pulmonary exam normal breath sounds clear to auscultation       Cardiovascular Exercise Tolerance: Good hypertension, On Medications (-) angina+ CAD  (-) Past MI, (-) Cardiac Stents and (-) CABG Normal cardiovascular exam(-) dysrhythmias (-) Valvular Problems/Murmurs Rhythm:regular Rate:Normal     Neuro/Psych negative neurological ROS  negative psych ROS   GI/Hepatic Neg liver ROS, GERD  ,  Endo/Other  diabetes (borderline)Hypothyroidism (subclinical)   Renal/GU negative Renal ROS  negative genitourinary   Musculoskeletal   Abdominal   Peds  Hematology negative hematology ROS (+)   Anesthesia Other Findings Past Medical History:   Osteoarthritis                                                 Comment:s/p bilat knee replacements   History of chicken pox                                       Prediabetes                                     2007         Seasonal allergic rhinitis                                   HTN (hypertension)                                           HLD (hyperlipidemia)                                         Cluster headache                                               Comment:as 70 yo, no more since retired (25 yrs ago)   H/O malaria                                     1969           Comment:during [place] (hospitalized in [place])   Coronary artery disease  Diabetes mellitus without  complication ([place])                 Environmental allergies                                      H/O varicella                                                Malaria                                                      Reproductive/Obstetrics negative OB ROS                             Anesthesia Physical Anesthesia Plan  ASA: II  Anesthesia Plan: General   Post-op Pain Management:    Induction:   Airway Management Planned:   Additional Equipment:   Intra-op Plan:   Post-operative Plan:   Informed Consent: I have reviewed the patients History and Physical, chart, labs and discussed the procedure including the risks, benefits and alternatives for the proposed anesthesia with the patient or authorized representative who has indicated his/her understanding and acceptance.   Dental Advisory Given  Plan Discussed with: Anesthesiologist, CRNA and Surgeon  Anesthesia Plan Comments:         Anesthesia Quick Evaluation

## 2015-09-09 NOTE — Anesthesia Postprocedure Evaluation (Signed)
Anesthesia Post Note  Patient: Jason Cooper  Procedure(s) Performed: Procedure(s) (LRB): ESOPHAGOGASTRODUODENOSCOPY (EGD) WITH PROPOFOL (N/A)  Patient location during evaluation: Endoscopy Anesthesia Type: General Level of consciousness: awake and alert Pain management: pain level controlled Vital Signs Assessment: post-procedure vital signs reviewed and stable Respiratory status: spontaneous breathing, nonlabored ventilation, respiratory function stable and patient connected to nasal cannula oxygen Cardiovascular status: blood pressure returned to baseline and stable Postop Assessment: no signs of nausea or vomiting Anesthetic complications: no    Last Vitals:  Filed Vitals:   09/09/15 1123 09/09/15 1133  BP: 124/77 115/77  Pulse: 84 80  Temp:    Resp: 17 6    Last Pain: There were no vitals filed for this visit.               Martha Clan

## 2015-09-09 NOTE — Op Note (Signed)
[facility] Gastroenterology Patient Name: Jason Cooper Procedure Date: 09/09/2015 10:35 AM MRN: [id number] Account #: [id number] Date of Birth: Nov 22, 1945 Admit Type: Outpatient Age: 70 Room: [facility] ENDO ROOM 4 Gender: Male Note Status: Finalized Procedure:            Upper GI endoscopy Indications:          Dysphagia Providers:            Manya Silvas, MD Referring MD:         Ria Bush (Referring MD) Medicines:            Propofol per Anesthesia Complications:        No immediate complications. Procedure:            Pre-Anesthesia Assessment:                       - After reviewing the risks and benefits, the patient                        was deemed in satisfactory condition to undergo the                        procedure.                       After obtaining informed consent, the endoscope was                        passed under direct vision. Throughout the procedure,                        the patient's blood pressure, pulse, and oxygen                        saturations were monitored continuously. The Endoscope                        was introduced through the mouth, and advanced to the                        second part of duodenum. The upper GI endoscopy was                        accomplished without difficulty. The patient tolerated                        the procedure well. Findings:      A small-medium-sized, fungating mass with no bleeding and no stigmata of       recent bleeding was found at the gastroesophageal junction, 40 cm from       the incisors. Retroflexed view shows ulcerated area. The mass was       partially obstructing and not circumferential. Just below this area is       an ulcerated mass area at about 43cm from teeth. An ulcerated mass can       be seen in retroflexed view and was biopsied. A deep ragged ulcerated       area exists between this area and the mass seen on retroflexed view.       Biopsies  done. 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                       - No specimens collected. Recommendation:       - The findings and recommendations were discussed with                        the patient's family. ASAP CT scan and oncology consult Manya Silvas, MD 09/09/2015 11:10:53 AM This report has been signed electronically. Number of Addenda: 0 Note Initiated On: 09/09/2015 10:35 AM      [facility]

## 2015-09-10 ENCOUNTER — Encounter: Payer: Self-pay | Admitting: Unknown Physician Specialty

## 2015-09-14 DIAGNOSIS — C159 Malignant neoplasm of esophagus, unspecified: Secondary | ICD-10-CM | POA: Insufficient documentation

## 2015-09-15 ENCOUNTER — Encounter: Payer: Self-pay | Admitting: Internal Medicine

## 2015-09-15 ENCOUNTER — Inpatient Hospital Stay: Payer: Medicare Other | Attending: Internal Medicine | Admitting: Internal Medicine

## 2015-09-15 ENCOUNTER — Emergency Department: Payer: Medicare Other

## 2015-09-15 ENCOUNTER — Observation Stay
Admission: EM | Admit: 2015-09-15 | Discharge: 2015-09-18 | Disposition: A | Discharge status: Home / Self Care | Payer: Medicare Other | Attending: Internal Medicine | Admitting: Internal Medicine

## 2015-09-15 VITALS — BP 117/78 | Temp 97.8°F | Resp 18 | Ht 74.5 in | Wt 253.5 lb

## 2015-09-15 DIAGNOSIS — R55 Syncope and collapse: Secondary | ICD-10-CM

## 2015-09-15 DIAGNOSIS — Z823 Family history of stroke: Secondary | ICD-10-CM | POA: Insufficient documentation

## 2015-09-15 DIAGNOSIS — M1990 Unspecified osteoarthritis, unspecified site: Secondary | ICD-10-CM | POA: Insufficient documentation

## 2015-09-15 DIAGNOSIS — E039 Hypothyroidism, unspecified: Secondary | ICD-10-CM | POA: Diagnosis not present

## 2015-09-15 DIAGNOSIS — Z8619 Personal history of other infectious and parasitic diseases: Secondary | ICD-10-CM | POA: Insufficient documentation

## 2015-09-15 DIAGNOSIS — K920 Hematemesis: Secondary | ICD-10-CM | POA: Diagnosis not present

## 2015-09-15 DIAGNOSIS — E785 Hyperlipidemia, unspecified: Secondary | ICD-10-CM | POA: Insufficient documentation

## 2015-09-15 DIAGNOSIS — I10 Essential (primary) hypertension: Secondary | ICD-10-CM | POA: Insufficient documentation

## 2015-09-15 DIAGNOSIS — C155 Malignant neoplasm of lower third of esophagus: Secondary | ICD-10-CM | POA: Insufficient documentation

## 2015-09-15 DIAGNOSIS — Z8349 Family history of other endocrine, nutritional and metabolic diseases: Secondary | ICD-10-CM | POA: Insufficient documentation

## 2015-09-15 DIAGNOSIS — I2510 Atherosclerotic heart disease of native coronary artery without angina pectoris: Secondary | ICD-10-CM | POA: Insufficient documentation

## 2015-09-15 DIAGNOSIS — E119 Type 2 diabetes mellitus without complications: Secondary | ICD-10-CM | POA: Insufficient documentation

## 2015-09-15 DIAGNOSIS — Z8249 Family history of ischemic heart disease and other diseases of the circulatory system: Secondary | ICD-10-CM | POA: Diagnosis not present

## 2015-09-15 DIAGNOSIS — Z87891 Personal history of nicotine dependence: Secondary | ICD-10-CM | POA: Insufficient documentation

## 2015-09-15 DIAGNOSIS — Z8042 Family history of malignant neoplasm of prostate: Secondary | ICD-10-CM | POA: Insufficient documentation

## 2015-09-15 DIAGNOSIS — K922 Gastrointestinal hemorrhage, unspecified: Secondary | ICD-10-CM

## 2015-09-15 DIAGNOSIS — J302 Other seasonal allergic rhinitis: Secondary | ICD-10-CM | POA: Insufficient documentation

## 2015-09-15 DIAGNOSIS — Z7982 Long term (current) use of aspirin: Secondary | ICD-10-CM | POA: Insufficient documentation

## 2015-09-15 DIAGNOSIS — Z9889 Other specified postprocedural states: Secondary | ICD-10-CM | POA: Diagnosis not present

## 2015-09-15 DIAGNOSIS — Z800 Family history of malignant neoplasm of digestive organs: Secondary | ICD-10-CM | POA: Insufficient documentation

## 2015-09-15 DIAGNOSIS — R112 Nausea with vomiting, unspecified: Secondary | ICD-10-CM | POA: Diagnosis not present

## 2015-09-15 DIAGNOSIS — R634 Abnormal weight loss: Secondary | ICD-10-CM | POA: Insufficient documentation

## 2015-09-15 DIAGNOSIS — D62 Acute posthemorrhagic anemia: Secondary | ICD-10-CM | POA: Diagnosis not present

## 2015-09-15 DIAGNOSIS — Z79899 Other long term (current) drug therapy: Secondary | ICD-10-CM | POA: Insufficient documentation

## 2015-09-15 DIAGNOSIS — Z833 Family history of diabetes mellitus: Secondary | ICD-10-CM | POA: Insufficient documentation

## 2015-09-15 DIAGNOSIS — R1310 Dysphagia, unspecified: Secondary | ICD-10-CM | POA: Insufficient documentation

## 2015-09-15 DIAGNOSIS — Z96653 Presence of artificial knee joint, bilateral: Secondary | ICD-10-CM | POA: Diagnosis not present

## 2015-09-15 DIAGNOSIS — Z808 Family history of malignant neoplasm of other organs or systems: Secondary | ICD-10-CM | POA: Diagnosis not present

## 2015-09-15 DIAGNOSIS — C159 Malignant neoplasm of esophagus, unspecified: Secondary | ICD-10-CM | POA: Diagnosis not present

## 2015-09-15 DIAGNOSIS — S0990XA Unspecified injury of head, initial encounter: Secondary | ICD-10-CM | POA: Diagnosis not present

## 2015-09-15 HISTORY — DX: Malignant neoplasm of lower third of esophagus: C15.5

## 2015-09-15 LAB — CBC WITH DIFFERENTIAL/PLATELET
BASOS ABS: 0 10*3/uL (ref 0–0.1)
Basophils Absolute: 0.1 10*3/uL (ref 0–0.1)
Basophils Relative: 1 %
EOS ABS: 0.2 10*3/uL (ref 0–0.7)
Eosinophils Absolute: 0.2 10*3/uL (ref 0–0.7)
Eosinophils Relative: 4 %
Eosinophils Relative: 4 %
HCT: 43.4 % (ref 40.0–52.0)
HEMATOCRIT: 32.9 % — AB (ref 40.0–52.0)
HEMOGLOBIN: 14.6 g/dL (ref 13.0–18.0)
Hemoglobin: 11.4 g/dL — ABNORMAL LOW (ref 13.0–18.0)
LYMPHS ABS: 1.3 10*3/uL (ref 1.0–3.6)
Lymphocytes Relative: 21 %
Lymphocytes Relative: 20 %
Lymphs Abs: 1.2 10*3/uL (ref 1.0–3.6)
MCH: 30.3 pg (ref 26.0–34.0)
MCH: 30.6 pg (ref 26.0–34.0)
MCHC: 33.7 g/dL (ref 32.0–36.0)
MCHC: 34.6 g/dL (ref 32.0–36.0)
MCV: 89.9 fL (ref 80.0–100.0)
MCV: 88.4 fL (ref 80.0–100.0)
MONO ABS: 0.5 10*3/uL (ref 0.2–1.0)
Monocytes Absolute: 0.6 10*3/uL (ref 0.2–1.0)
Monocytes Relative: 10 %
Monocytes Relative: 9 %
NEUTROS ABS: 4.1 10*3/uL (ref 1.4–6.5)
Neutro Abs: 4.1 10*3/uL (ref 1.4–6.5)
Neutrophils Relative %: 64 %
Platelets: 241 10*3/uL (ref 150–440)
Platelets: 202 10*3/uL (ref 150–440)
RBC: 4.82 MIL/uL (ref 4.40–5.90)
RBC: 3.72 MIL/uL — ABNORMAL LOW (ref 4.40–5.90)
RDW: 13.4 % (ref 11.5–14.5)
RDW: 13.1 % (ref 11.5–14.5)
WBC: 6.3 10*3/uL (ref 3.8–10.6)
WBC: 6.1 10*3/uL (ref 3.8–10.6)

## 2015-09-15 LAB — COMPREHENSIVE METABOLIC PANEL
ALBUMIN: 3 g/dL — AB (ref 3.5–5.0)
ALK PHOS: 88 U/L (ref 38–126)
ALT: 21 U/L (ref 17–63)
ALT: 17 U/L (ref 17–63)
AST: 25 U/L (ref 15–41)
AST: 22 U/L (ref 15–41)
Albumin: 4.2 g/dL (ref 3.5–5.0)
Alkaline Phosphatase: 60 U/L (ref 38–126)
Anion gap: 6 (ref 5–15)
Anion gap: 10 (ref 5–15)
BILIRUBIN TOTAL: 0.9 mg/dL (ref 0.3–1.2)
BILIRUBIN TOTAL: 0.6 mg/dL (ref 0.3–1.2)
BUN: 18 mg/dL (ref 6–20)
BUN: 19 mg/dL (ref 6–20)
CALCIUM: 9.8 mg/dL (ref 8.9–10.3)
CHLORIDE: 105 mmol/L (ref 101–111)
CO2: 26 mmol/L (ref 22–32)
CO2: 18 mmol/L — AB (ref 22–32)
CREATININE: 1.23 mg/dL (ref 0.61–1.24)
Calcium: 8.4 mg/dL — ABNORMAL LOW (ref 8.9–10.3)
Chloride: 102 mmol/L (ref 101–111)
Creatinine, Ser: 1.01 mg/dL (ref 0.61–1.24)
GFR calc Af Amer: >60 mL/min (ref >60)
GFR calc non Af Amer: >60 mL/min (ref >60)
GFR, EST NON AFRICAN AMERICAN: 58 mL/min — AB (ref >60)
GLUCOSE: 175 mg/dL — AB (ref 65–99)
Glucose, Bld: 115 mg/dL — ABNORMAL HIGH (ref 65–99)
POTASSIUM: 3.6 mmol/L (ref 3.5–5.1)
Potassium: 3.9 mmol/L (ref 3.5–5.1)
SODIUM: 133 mmol/L — AB (ref 135–145)
Sodium: 134 mmol/L — ABNORMAL LOW (ref 135–145)
TOTAL PROTEIN: 7.3 g/dL (ref 6.5–8.1)
Total Protein: 5.4 g/dL — ABNORMAL LOW (ref 6.5–8.1)

## 2015-09-15 LAB — APTT: APTT: 25 s (ref 24–36)

## 2015-09-15 LAB — MAGNESIUM: MAGNESIUM: 1.6 mg/dL — AB (ref 1.7–2.4)

## 2015-09-15 LAB — HEMOGLOBIN: HEMOGLOBIN: 11.3 g/dL — AB (ref 13.0–18.0)

## 2015-09-15 LAB — PROTIME-INR
INR: 1.21
Prothrombin Time: 15.5 seconds — ABNORMAL HIGH (ref 11.4–15.0)

## 2015-09-15 LAB — TROPONIN I: Troponin I: <0.03 ng/mL (ref <0.031)

## 2015-09-15 MED ORDER — SODIUM CHLORIDE 0.9 % IV SOLN
80.0000 mg | Freq: Once | INTRAVENOUS | Status: AC
  Administered 2015-09-15: 80 mg via INTRAVENOUS
  Filled 2015-09-15: qty 80

## 2015-09-15 MED ORDER — SIMVASTATIN 40 MG PO TABS
40.0000 mg | ORAL_TABLET | Freq: Every day | ORAL | Status: DC
  Administered 2015-09-15 – 2015-09-17 (×3): 40 mg via ORAL
  Filled 2015-09-15 (×3): qty 1

## 2015-09-15 MED ORDER — MAGNESIUM SULFATE 2 GM/50ML IV SOLN
2.0000 g | Freq: Once | INTRAVENOUS | Status: AC
  Administered 2015-09-15: 2 g via INTRAVENOUS
  Filled 2015-09-15: qty 50

## 2015-09-15 MED ORDER — VITAMIN D 1000 UNITS PO TABS
1000.0000 [IU] | ORAL_TABLET | Freq: Every day | ORAL | Status: DC
  Administered 2015-09-16 – 2015-09-18 (×3): 1000 [IU] via ORAL
  Filled 2015-09-15 (×3): qty 1

## 2015-09-15 MED ORDER — SODIUM CHLORIDE 0.9 % IV BOLUS (SEPSIS)
1000.0000 mL | Freq: Once | INTRAVENOUS | Status: AC
  Administered 2015-09-15: 1000 mL via INTRAVENOUS

## 2015-09-15 MED ORDER — ONDANSETRON HCL 4 MG/2ML IJ SOLN
4.0000 mg | Freq: Once | INTRAMUSCULAR | Status: AC
  Administered 2015-09-15: 4 mg via INTRAVENOUS
  Filled 2015-09-15: qty 2

## 2015-09-15 MED ORDER — LORATADINE 10 MG PO TABS
10.0000 mg | ORAL_TABLET | Freq: Every day | ORAL | Status: DC
  Administered 2015-09-16 – 2015-09-18 (×3): 10 mg via ORAL
  Filled 2015-09-15 (×3): qty 1

## 2015-09-15 MED ORDER — VITAMIN B-12 1000 MCG PO TABS
2000.0000 ug | ORAL_TABLET | Freq: Every day | ORAL | Status: DC
  Administered 2015-09-16 – 2015-09-18 (×3): 2000 ug via ORAL
  Filled 2015-09-15 (×3): qty 2

## 2015-09-15 MED ORDER — SODIUM CHLORIDE 0.9 % IV SOLN
8.0000 mg/h | INTRAVENOUS | Status: DC
  Administered 2015-09-15 – 2015-09-17 (×5): 8 mg/h via INTRAVENOUS
  Filled 2015-09-15 (×5): qty 80

## 2015-09-15 NOTE — H&P (Signed)
[facility] [place] NAME: Jason Cooper    MR#:  [id number]  DATE OF BIRTH:  1945-05-19  DATE OF ADMISSION:  09/15/2015  PRIMARY CARE PHYSICIAN: Ria Bush, MD   REQUESTING/REFERRING PHYSICIAN: Edd Fabian  CHIEF COMPLAINT:   Chief Complaint  Patient presents with  . Loss of Consciousness  . Hematemesis    HISTORY OF PRESENT ILLNESS: Jason Cooper  is a 70 y.o. male with a known history of Osteoarthritis, prediabetes, hypertension, hyperlipidemia, coronary artery disease, recently found esophageal cancer ( May 2017) , seen in [facility] the first time today and the plan is to start him on radiation therapy for now and referred to [facility] for EUS. After that appointment he went home and while he was at home he suddenly passed out while walking. He also had 1 vomiting which was containing SMALL amount of blood, he described it to be of a small medicine cup full. Social family called EMS and his blood pressure was running very low almost 50 systolic when they came in. In ER his hemoglobin was noted to be 3 g lower than what was in cancer Center in afternoon today. Stool for guaiac was negative as per ER physician. Blood pressure stable in emergency room. At baseline he has some swallowing difficulty in food for last few weeks that was the reason to prompt having endoscopy last week and since then he came to know about his esophageal cancer. He lost 20 pounds weight in last 1 month.  PAST MEDICAL HISTORY:   Past Medical History  Diagnosis Date  . Osteoarthritis     s/p bilat knee replacements  . History of chicken pox   . Prediabetes 2007  . Seasonal allergic rhinitis   . HTN (hypertension)   . HLD (hyperlipidemia)   . Cluster headache     as 70 yo, no more since retired (25 yrs ago)  . H/O malaria 1969    during [place] (hospitalized in [place])  . Coronary artery disease   . Diabetes mellitus without complication ([place])   .  Environmental allergies   . H/O varicella   . Malaria   . Cancer [facility])     esophageal cancer    PAST SURGICAL HISTORY: Past Surgical History  Procedure Laterality Date  . Total knee arthroplasty Bilateral 2003, 2014    right-2003; left-2014  . Ankle surgery Left 11-1999    haglin deformity [name])  . Colonoscopy  2008    WNL, rec rpt 10 yrs [name])  . Joint replacement Right 02/2002  . Esophagogastroduodenoscopy (egd) with propofol N/A 09/09/2015    Procedure: ESOPHAGOGASTRODUODENOSCOPY (EGD) WITH PROPOFOL;  Surgeon: Manya Silvas, MD;  Location: [place];  Service: Endoscopy;  Laterality: N/A;    SOCIAL HISTORY:  Social History  Substance Use Topics  . Smoking status: Former Smoker    Types: Cigarettes    Quit date: 06/02/1997  . Smokeless tobacco: Never Used  . Alcohol Use: 0.0 oz/week    0 Standard drinks or equivalent per week    FAMILY HISTORY:  Family History  Problem Relation Age of Onset  . Hypertension Mother   . Stroke Mother   . Thyroid disease Mother     needed it removed, unsure why  . Stroke Other     maternal and paternal grandparents  . Hypertension Father   . Diabetes Father   . Cancer Paternal Aunt     bone  . Cancer Paternal Aunt  pancreas  . Cancer Cousin     prostate  . CAD Neg Hx   . Cancer Sister     DRUG ALLERGIES: No Known Allergies  REVIEW OF SYSTEMS:   CONSTITUTIONAL: No fever, Positive fatigue or weakness.  EYES: No blurred or double vision.  EARS, NOSE, AND THROAT: No tinnitus or ear pain.  RESPIRATORY: No cough, shortness of breath, wheezing or hemoptysis.  CARDIOVASCULAR: No chest pain, orthopnea, edema.  GASTROINTESTINAL: No nausea, had 1 vomiting with blood, no diarrhea or abdominal pain.  GENITOURINARY: No dysuria, hematuria.  ENDOCRINE: No polyuria, nocturia,  HEMATOLOGY: No anemia, easy bruising or bleeding SKIN: No rash or lesion. MUSCULOSKELETAL: No joint pain or arthritis.   NEUROLOGIC: No tingling,  numbness, weakness.  PSYCHIATRY: No anxiety or depression.   MEDICATIONS AT HOME:  Prior to Admission medications   Medication Sig Start Date End Date Taking? Authorizing Provider  aspirin EC 81 MG tablet Take 81 mg by mouth at bedtime.    Yes Historical Provider, MD  cetirizine (ZYRTEC) 10 MG tablet Take 10 mg by mouth daily.   Yes Historical Provider, MD  cholecalciferol (VITAMIN D) 1000 units tablet Take 1,000 Units by mouth daily.   Yes Historical Provider, MD  ibuprofen (ADVIL,MOTRIN) 200 MG tablet Take 200 mg by mouth every 6 (six) hours as needed for headache or mild pain.    Yes Historical Provider, MD  lisinopril-hydrochlorothiazide (PRINZIDE,ZESTORETIC) 20-12.5 MG per tablet Take 1 tablet by mouth daily.   Yes Historical Provider, MD  pantoprazole (PROTONIX) 40 MG tablet Take 40 mg by mouth 2 (two) times daily before a meal.    Yes Historical Provider, MD  simvastatin (ZOCOR) 40 MG tablet Take 40 mg by mouth at bedtime.    Yes Historical Provider, MD  vitamin B-12 (CYANOCOBALAMIN) 1000 MCG tablet Take 2,000 mcg by mouth daily.   Yes Historical Provider, MD      PHYSICAL EXAMINATION:   VITAL SIGNS: Blood pressure 113/66, pulse 83, temperature 97.8 F (36.6 C), temperature source Oral, resp. rate 17, height 6\' 2"  (1.88 m), weight 114.76 kg (253 lb), SpO2 96 %.  GENERAL:  70 y.o.-year-old patient lying in the bed with no acute distress.  EYES: Pupils equal, round, reactive to light and accommodation. No scleral icterus. Extraocular muscles intact.  HEENT: Head atraumatic, normocephalic. Oropharynx and nasopharynx clear.  NECK:  Supple, no jugular venous distention. No thyroid enlargement, no tenderness.  LUNGS: Normal breath sounds bilaterally, no wheezing, rales,rhonchi or crepitation. No use of accessory muscles of respiration.  CARDIOVASCULAR: S1, S2 normal. No murmurs, rubs, or gallops.  ABDOMEN: Soft, nontender, nondistended. Bowel sounds present. No organomegaly or mass.   EXTREMITIES: No pedal edema, cyanosis, or clubbing.  NEUROLOGIC: Cranial nerves II through XII are intact. Muscle strength 5/5 in all extremities. Sensation intact. Gait not checked.  PSYCHIATRIC: The patient is alert and oriented x 3.  SKIN: No obvious rash, lesion, or ulcer.   LABORATORY PANEL:   CBC  Recent Labs Lab 09/15/15 1424 09/15/15 1732  WBC 6.3 6.1  HGB 14.6 11.4*  HCT 43.4 32.9*  PLT 241 202  MCV 89.9 88.4  MCH 30.3 30.6  MCHC 33.7 34.6  RDW 13.4 13.1  LYMPHSABS 1.3 1.2  MONOABS 0.6 0.5  EOSABS 0.2 0.2  BASOSABS 0.1 0.0   ------------------------------------------------------------------------------------------------------------------  Chemistries   Recent Labs Lab 09/15/15 1424 09/15/15 1732  NA 134* 133*  K 3.9 3.6  CL 102 105  CO2 26 18*  GLUCOSE 115* 175*  BUN 18 19  CREATININE 1.23 1.01  CALCIUM 9.8 8.4*  MG  --  1.6*  AST 25 22  ALT 21 17  ALKPHOS 88 60  BILITOT 0.9 0.6   ------------------------------------------------------------------------------------------------------------------ estimated creatinine clearance is 92.9 mL/min (by C-G formula based on Cr of 1.01). ------------------------------------------------------------------------------------------------------------------ No results for input(s): TSH, T4TOTAL, T3FREE, THYROIDAB in the last 72 hours.  Invalid input(s): FREET3   Coagulation profile  Recent Labs Lab 09/15/15 1732  INR 1.21   ------------------------------------------------------------------------------------------------------------------- No results for input(s): DDIMER in the last 72 hours. -------------------------------------------------------------------------------------------------------------------  Cardiac Enzymes  Recent Labs Lab 09/15/15 1732  TROPONINI <0.03   ------------------------------------------------------------------------------------------------------------------ Invalid input(s):  POCBNP  ---------------------------------------------------------------------------------------------------------------  Urinalysis    Component Value Date/Time   COLORURINE Straw 02/05/2013 1022   APPEARANCEUR Clear 02/05/2013 1022   LABSPEC 1.006 02/05/2013 1022   PHURINE 6.0 02/05/2013 1022   GLUCOSEU Negative 02/05/2013 1022   HGBUR Negative 02/05/2013 1022   BILIRUBINUR Negative 02/05/2013 1022   KETONESUR Negative 02/05/2013 1022   PROTEINUR Negative 02/05/2013 1022   NITRITE Negative 02/05/2013 1022   LEUKOCYTESUR Negative 02/05/2013 1022     RADIOLOGY: Ct Head Wo Contrast  09/15/2015  CLINICAL DATA:  Syncope and fall.  Initial encounter. EXAM: CT HEAD WITHOUT CONTRAST TECHNIQUE: Contiguous axial images were obtained from the base of the skull through the vertex without intravenous contrast. COMPARISON:  None. FINDINGS: Skull and Sinuses:Negative for fracture or destructive process. Lucent appearance in the bilateral pterygoid body is likely arrested aeration. Visualized orbits: Negative. Brain: No evidence of acute infarction, hemorrhage, hydrocephalus, or mass lesion/mass effect. Mild generalized atrophy. IMPRESSION: No acute or posttraumatic finding. Electronically Signed   By: Monte Fantasia M.D.   On: 09/15/2015 19:30   Dg Abd Acute W/chest  09/15/2015  CLINICAL DATA:  Nausea today, loss of consciousness, vomiting. Bloody emesis following syncope. History of hypertension, esophageal carcinoma.  Ex-smoker. EXAM: DG ABDOMEN ACUTE W/ 1V CHEST COMPARISON:  Chest x-ray dated 11/27/2006. FINDINGS: Single view of the chest: Study is hypoinspiratory. Given the low lung volumes, lungs are clear. Heart size is slightly accentuated by the low lung volumes, not significantly changed in size compared to the previous exam. No pleural effusion or pneumothorax seen. Osseous and soft tissue structures about the chest are unremarkable. Supine and upright views of the abdomen: Bowel gas pattern  is nonobstructive. Stomach moderately distended with air. No evidence of soft tissue mass or abnormal fluid collection. No evidence of free intraperitoneal air. No acute-appearing osseous abnormality. IMPRESSION: 1. Low lung volumes. No evidence of acute cardiopulmonary abnormality. 2. Nonobstructive bowel gas pattern and no evidence of acute intra-abdominal abnormality. Stomach moderately distended with air. Electronically Signed   By: Franki Cabot M.D.   On: 09/15/2015 19:03    EKG: Orders placed or performed during the hospital encounter of 09/15/15  . ED EKG  . ED EKG  . EKG 12-Lead  . EKG 12-Lead    IMPRESSION AND PLAN:  * GI bleed   Likely secondary to his esophageal cancer   As there is significant drop in the hemoglobin, but he had just a few tablespoonful amount of blood in his vomitus, the drop in the hemoglobin seems to be lab error.   We will check his hemoglobin again frequently.   Currently patient is hemodynamically stable.   GI consult.  * Syncopal episode with vomiting   Patient's blood pressure was noted to be very low with that.   Likely that is due to dehydration and malnutrition  and still he Taking hydrochlorothiazide.   Hold hypertensive medications for now.   Keep on telemetry monitoring.  * Esophageal cancer   Patient has followed with cancer Center and the plan is to get radiation and refer him to [facility], I advised to continue following as the plan.  * Hypomagnesemia   Replace IV.  * Hyperlipidemia   Continue atorvastatin. All the records are reviewed and case discussed with ED provider. Management plans discussed with the patient, family and they are in agreement.  CODE STATUS: Code Status History    This patient does not have a recorded code status. Please follow your organizational policy for patients in this situation.    Advance Directive Documentation        Most Recent Value   Type of Advance Directive  Healthcare Power of Attorney, Living  will   Pre-existing out of facility DNR order (yellow form or pink MOST form)     "MOST" Form in Place?         TOTAL TIME TAKING CARE OF THIS PATIENT: 50 minutes.    Vaughan Basta M.D on 09/15/2015   Between 7am to 6pm - Pager - [phone]  After 6pm go to [email or web address] - password [email or web address] [facility]  Office  [phone]  CC: Primary care physician; Ria Bush, MD   Note: This dictation was prepared with Dragon dictation along with smaller phrase technology. Any transcriptional errors that result from this process are unintentional.

## 2015-09-15 NOTE — ED Notes (Addendum)
Pt arrived from home via EMS c/o syncope. Per EMS pt recently diagnosed with esophageal cancer; pt reported to EMS that he visited his Cancer doctor today and when he arrived home drank a milkshake and suddenly began to feel diaphoretic, nauseous, vomited, and had two syncopal episodes. EMS stated at the scene initial BP 50/23, and they observed pt vomit bright red blood and administered 1L of normal saline. Pt alert and oriented in no acute distress at this time.

## 2015-09-15 NOTE — ED Notes (Signed)
MD at bedside. 

## 2015-09-15 NOTE — ED Provider Notes (Signed)
[facility] Emergency Department Provider Note   ____________________________________________  Time seen: Approximately 5:20 PM  I have reviewed the triage vital signs and the nursing notes.   HISTORY  Chief Complaint Loss of Consciousness and Hematemesis    HPI Jason Cooper is a 70 y.o. male with recent diagnosis of GE junction adenocarcinoma, not yet on chemotherapy or radiation who presents for evaluation of syncope which occurred today, gradual onset, now resolved, no modifying factors. Patient had his first appointment with oncology this morning, afterwards he stopped to get a milkshake at a restaurant. He reports that he was at home drinking the milkshake while he was sitting on the couch. He started feeling nauseated as if he was about to vomit, stood up and was walking towards the bathroom and syncopized hitting his head. He did vomit. His wife tried to get him but he fainted again. He was diaphoretic. On EMS arrival, his systolic blood pressure was in the 50s and this improved with IV fluids. He had one episode of bloody emesis in the presence of EMS. He denies any prior to the emesis, no dark or tarry stools, no frank blood in his stools. He denies any chest pain or shortness of breath. He did hit his head when he lost consciousness and fell. He is having some mild epigastric pain currently.   Past Medical History  Diagnosis Date  . Osteoarthritis     s/p bilat knee replacements  . History of chicken pox   . Prediabetes 2007  . Seasonal allergic rhinitis   . HTN (hypertension)   . HLD (hyperlipidemia)   . Cluster headache     as 70 yo, no more since retired (25 yrs ago)  . H/O malaria 1969    during [place] (hospitalized in [place])  . Coronary artery disease   . Diabetes mellitus without complication ([place])   . Environmental allergies   . H/O varicella   . Malaria   . Cancer [facility])     esophageal cancer    Patient Active Problem List     Diagnosis Date Noted  . Malignant neoplasm of lower third of esophagus ([place]) 09/15/2015  . Dysphagia 09/15/2015  . Weight loss, abnormal 09/15/2015  . Subclinical hypothyroidism 07/15/2015  . Esophageal dysphagia 07/15/2015  . Advanced care planning/counseling discussion 07/11/2014  . Medicare annual wellness visit, subsequent 07/09/2013  . HTN (hypertension)   . HLD (hyperlipidemia)   . Prediabetes   . Seasonal allergic rhinitis   . Osteoarthritis     Past Surgical History  Procedure Laterality Date  . Total knee arthroplasty Bilateral 2003, 2014    right-2003; left-2014  . Ankle surgery Left 11-1999    haglin deformity [name])  . Colonoscopy  2008    WNL, rec rpt 10 yrs [name])  . Joint replacement Right 02/2002  . Esophagogastroduodenoscopy (egd) with propofol N/A 09/09/2015    Procedure: ESOPHAGOGASTRODUODENOSCOPY (EGD) WITH PROPOFOL;  Surgeon: Manya Silvas, MD;  Location: [place];  Service: Endoscopy;  Laterality: N/A;    Current Outpatient Rx  Name  Route  Sig  Dispense  Refill  . aspirin EC 81 MG tablet   Oral   Take 81 mg by mouth daily.         . cetirizine (ZYRTEC) 10 MG tablet   Oral   Take 10 mg by mouth daily.         . Cholecalciferol (VITAMIN D-3) 1000 UNITS CAPS   Oral   Take 1  capsule by mouth daily.         . cyanocobalamin 2000 MCG tablet   Oral   Take 2,000 mcg by mouth daily.         Marland Kitchen ibuprofen (ADVIL,MOTRIN) 200 MG tablet   Oral   Take 200 mg by mouth every 6 (six) hours as needed.         Marland Kitchen lisinopril-hydrochlorothiazide (PRINZIDE,ZESTORETIC) 20-12.5 MG per tablet   Oral   Take 1 tablet by mouth daily.         . Multiple Vitamin (MULTIVITAMIN) tablet   Oral   Take 1 tablet by mouth daily.         . naproxen sodium (ANAPROX) 220 MG tablet   Oral   Take 220 mg by mouth daily as needed.          . pantoprazole (PROTONIX) 40 MG tablet   Oral   Take by mouth.         . simvastatin (ZOCOR) 40 MG  tablet   Oral   Take 40 mg by mouth daily.           Allergies Review of patient's allergies indicates no known allergies.  Family History  Problem Relation Age of Onset  . Hypertension Mother   . Stroke Mother   . Thyroid disease Mother     needed it removed, unsure why  . Stroke Other     maternal and paternal grandparents  . Hypertension Father   . Diabetes Father   . Cancer Paternal Aunt     bone  . Cancer Paternal Aunt     pancreas  . Cancer Cousin     prostate  . CAD Neg Hx   . Cancer Sister     Social History Social History  Substance Use Topics  . Smoking status: Former Smoker    Types: Cigarettes    Quit date: 06/02/1997  . Smokeless tobacco: Never Used  . Alcohol Use: 0.0 oz/week    0 Standard drinks or equivalent per week    Review of Systems Constitutional: No fever/chills Eyes: No visual changes. ENT: No sore throat. Cardiovascular: Denies chest pain. Respiratory: Denies shortness of breath. Gastrointestinal: + mild epigastric abdominal pain.  + nausea, + vomiting.  No diarrhea.  No constipation. Genitourinary: Negative for dysuria. Musculoskeletal: Negative for back pain. Skin: Negative for rash. Neurological: Negative for headaches, focal weakness or numbness.  10-point ROS otherwise negative.  ____________________________________________   PHYSICAL EXAM:  Filed Vitals:   09/15/15 1730 09/15/15 1800 09/15/15 1930 09/15/15 2000  BP: 111/74 113/62 121/73 113/66  Pulse: 67 69 85 83  Temp:      TempSrc:      Resp: 22 22 19 17   Height:      Weight:      SpO2: 99% 100% 97% 96%    VITAL SIGNS: ED Triage Vitals  Enc Vitals Group     BP --      Pulse --      Resp --      Temp --      Temp src --      SpO2 --      Weight --      Height --      Head Cir --      Peak Flow --      Pain Score --      Pain Loc --      Pain Edu? --      Excl. in  GC? --     Constitutional: Alert and oriented. Nontoxic appearing and in no acute  distress. Eyes: Conjunctivae are normal. PERRL. EOMI. Head: Atraumatic. Nose: No congestion/rhinnorhea. Mouth/Throat: Mucous membranes are moist.  Oropharynx non-erythematous. Neck: No stridor.  No cervical spine tenderness to palpation. Cardiovascular: Normal rate, regular rhythm. Grossly normal heart sounds.  Good peripheral circulation. Respiratory: Normal respiratory effort.  No retractions. Lungs CTAB. Gastrointestinal: Soft and nontender. No distention.  No CVA tenderness. Genitourinary: deferred Rectal: clear mucous in the rectal vault is guaiac negative Musculoskeletal: No lower extremity tenderness nor edema.  No joint effusions. Neurologic:  Normal speech and language. No gross focal neurologic deficits are appreciated.  Skin:  Skin is warm, dry and intact. No rash noted. Psychiatric: Mood and affect are normal. Speech and behavior are normal.  ____________________________________________   LABS (all labs ordered are listed, but only abnormal results are displayed)  Labs Reviewed  CBC WITH DIFFERENTIAL/PLATELET - Abnormal; Notable for the following:    RBC 3.72 (*)    Hemoglobin 11.4 (*)    HCT 32.9 (*)    All other components within normal limits  COMPREHENSIVE METABOLIC PANEL - Abnormal; Notable for the following:    Sodium 133 (*)    CO2 18 (*)    Glucose, Bld 175 (*)    Calcium 8.4 (*)    Total Protein 5.4 (*)    Albumin 3.0 (*)    All other components within normal limits  PROTIME-INR - Abnormal; Notable for the following:    Prothrombin Time 15.5 (*)    All other components within normal limits  MAGNESIUM - Abnormal; Notable for the following:    Magnesium 1.6 (*)    All other components within normal limits  TROPONIN I  APTT  URINALYSIS COMPLETEWITH MICROSCOPIC ([facility] ONLY)   ____________________________________________  EKG  ED ECG REPORT I, Joanne Gavel, the attending physician, personally viewed and interpreted this ECG.   Date: 09/15/2015   EKG Time: 17:33  Rate: 69  Rhythm: normal sinus rhythm  Axis: normal  Intervals:none  ST&T Change: No acute ST elevation. RSR prime in V1 and V2. Normal QTC.  ____________________________________________  RADIOLOGY  3 view abdominal plain films IMPRESSION: 1. Low lung volumes. No evidence of acute cardiopulmonary abnormality. 2. Nonobstructive bowel gas pattern and no evidence of acute intra-abdominal abnormality. Stomach moderately distended with air.  CT head IMPRESSION: No acute or posttraumatic finding. ____________________________________________   PROCEDURES  Procedure(s) performed: None  Critical Care performed: No  ____________________________________________   INITIAL IMPRESSION / ASSESSMENT AND PLAN / ED COURSE  Pertinent labs & imaging results that were available during my care of the patient were reviewed by me and considered in my medical decision making (see chart for details).  Jason Cooper is a 70 y.o. male with recent diagnosis of GE junction adenocarcinoma, not yet on chemotherapy or radiation who presents for evaluation of syncope which occurred today. On exam, he is nontoxic appearing and in no acute distress, his vital signs are stable and he is afebrile. He has an intact/nonfocal neurological examination. EKG not consistent with acute ischemia. We'll obtain screening labs, CT head, 3 view abdominal plain films and hydrate him. Reassess for disposition.  ----------------------------------------- 8:07 PM on 09/15/2015 ----------------------------------------- Patient remains hemodynamically stable, he has no acute complaints. I reviewed his labs. His hemoglobin is 11.4 which is a 3 point drop in his hemoglobin as it was 14.6 just this afternoon. He has had no recurrence of vomiting or hematemesis  in the ER since arrival, guaiac negative from below. I have ordered Protonix and discussed the case with the hospitalist for admission at this  time.  ____________________________________________   FINAL CLINICAL IMPRESSION(S) / ED DIAGNOSES  Final diagnoses:  Non-intractable vomiting with nausea, vomiting of unspecified type  Syncope, unspecified syncope type  UGI bleed      NEW MEDICATIONS STARTED DURING THIS VISIT:  New Prescriptions   No medications on file     Note:  This document was prepared using Dragon voice recognition software and may include unintentional dictation errors.    Joanne Gavel, MD 09/15/15 2009

## 2015-09-15 NOTE — ED Notes (Signed)
Pt hemoglobin 14.6 earlier today now 11.4, MD made aware.

## 2015-09-15 NOTE — Assessment & Plan Note (Addendum)
GE junction adenocarcinoma moderately differentiated. Recommend a PET scan. Recommend a EUS. We will check HER-2/neu status. I reviewed at length that the treatment options for GE junction adenocarcinoma- would be based upon the above staging workup. If surgically amenable to resection- would recommend neoadjuvant chemoradiation therapy. If not surgically resectable/locally advanced - would recommend definitive chemoradiation therapy. No definite plans for treatment at this time. Recommend radiation oncology evaluation. Patient also has a second opinion at [facility] next week.  Check CBC CMP CEA today.  I spoke to Anderson- we'll plan to get EUS at [facility] as soon as possible.

## 2015-09-15 NOTE — Progress Notes (Signed)
New evaluation for patient regarding gastric, referred by Dr. Vira Agar,

## 2015-09-15 NOTE — Assessment & Plan Note (Addendum)
Secondary dysphagia. Recommend increased protein intake. Also recommend nutrition evaluation

## 2015-09-15 NOTE — Assessment & Plan Note (Addendum)
Secondary to esophageal cancer. Mostly to solids; recommend semisolid food/liquids.

## 2015-09-15 NOTE — Progress Notes (Signed)
[facility] NOTE  Patient Care Team: Ria Bush, MD as PCP - General (Family Medicine)  CHIEF COMPLAINTS/PURPOSE OF CONSULTATION: Esophageal cancer    Malignant neoplasm of lower third of esophagus ([place])   09/09/2015 Initial Diagnosis [place] Ca of lower third of esophagus ([place])- EGD-Dr.Elliot; partially obstructing mass GE junction;    HISTORY OF PRESENTING ILLNESS:  Jason Cooper 70 y.o.  male with no significant past medical history except for hypertension/borderline hypothyroidism- noted to have difficulty swallowing since April 2017- especially to solids. Patient noted to have regurgitation of the food. Patient was eventually evaluated by gastroenterology- barium study showed possible stricture at the distal end of the esophagus- and started on PPI. Symptoms did not improve/worsened-went on to have EGD- that showed findings as above. Patient is here to review pathology and further treatment options.  Patient lost about 30 pounds in the last 2 months. He has a fair appetite. Denies any nausea vomiting. Denies any lumps or bumps anywhere else. Denies any chest pain or shortness of breath or cough. Denies any blood in stools black stools.   ROS: A complete 10 point review of system is done which is negative except mentioned above in history of present illness  MEDICAL HISTORY:  Past Medical History  Diagnosis Date  . Osteoarthritis     s/p bilat knee replacements  . History of chicken pox   . Prediabetes 2007  . Seasonal allergic rhinitis   . HTN (hypertension)   . HLD (hyperlipidemia)   . Cluster headache     as 70 yo, no more since retired (25 yrs ago)  . H/O malaria 1969    during [place] (hospitalized in [place])  . Coronary artery disease   . Diabetes mellitus without complication ([place])   . Environmental allergies   . H/O varicella   . Malaria     SURGICAL HISTORY: Past Surgical History  Procedure Laterality Date  . Total knee arthroplasty  Bilateral 2003, 2014    right-2003; left-2014  . Ankle surgery Left 11-1999    haglin deformity [name])  . Colonoscopy  2008    WNL, rec rpt 10 yrs [name])  . Joint replacement Right 02/2002  . Esophagogastroduodenoscopy (egd) with propofol N/A 09/09/2015    Procedure: ESOPHAGOGASTRODUODENOSCOPY (EGD) WITH PROPOFOL;  Surgeon: Manya Silvas, MD;  Location: [facility];  Service: Endoscopy;  Laterality: N/A;    SOCIAL HISTORY: Social History   Social History  . Marital Status: Married    Spouse Name: N/A  . Number of Children: N/A  . Years of Education: N/A   Occupational History  . Not on file.   Social History Main Topics  . Smoking status: Former Smoker    Types: Cigarettes    Quit date: 06/02/1997  . Smokeless tobacco: Never Used  . Alcohol Use: 0.0 oz/week    0 Standard drinks or equivalent per week  . Drug Use: No  . Sexual Activity: Not on file   Other Topics Concern  . Not on file   Social History Narrative   Lives with wife, no pets   Occupation: retired Engineer, structural   Edu: graduate degree   Activity: enjoys traveling, rides stationary bike every morning 30 min, walking 2 mi/day, goes to [employer]   Diet: good water, fruits/vegetables daily    FAMILY HISTORY: Family History  Problem Relation Age of Onset  . Hypertension Mother   . Stroke Mother   . Stroke Other     maternal and  paternal grandparents  . Hypertension Father   . Diabetes Father   . Cancer Paternal Aunt     bone  . Cancer Paternal Aunt     pancreas  . Cancer Cousin     prostate  . CAD Neg Hx   . Thyroid disease Mother     needed it removed, unsure why    ALLERGIES:  has No Known Allergies.  MEDICATIONS:  Current Outpatient Prescriptions  Medication Sig Dispense Refill  . aspirin EC 81 MG tablet Take 81 mg by mouth daily.    . cetirizine (ZYRTEC) 10 MG tablet Take 10 mg by mouth daily.    . Cholecalciferol (VITAMIN D-3) 1000 UNITS CAPS Take 1 capsule by mouth daily.    .  cyanocobalamin 2000 MCG tablet Take 2,000 mcg by mouth daily.    Marland Kitchen ibuprofen (ADVIL,MOTRIN) 200 MG tablet Take 200 mg by mouth every 6 (six) hours as needed.    Marland Kitchen lisinopril-hydrochlorothiazide (PRINZIDE,ZESTORETIC) 20-12.5 MG per tablet Take 1 tablet by mouth daily.    . Multiple Vitamin (MULTIVITAMIN) tablet Take 1 tablet by mouth daily.    . naproxen sodium (ANAPROX) 220 MG tablet Take 220 mg by mouth daily as needed.     . pantoprazole (PROTONIX) 40 MG tablet Take by mouth.    . simvastatin (ZOCOR) 40 MG tablet Take 40 mg by mouth daily.     No current facility-administered medications for this visit.      Marland Kitchen  PHYSICAL EXAMINATION: ECOG PERFORMANCE STATUS: 1 - Symptomatic but completely ambulatory  Filed Vitals:   09/15/15 1337  BP: 117/78  Temp: 97.8 F (36.6 C)  Resp: 18   Filed Weights   09/15/15 1337  Weight: 253 lb 8.5 oz (115 kg)    GENERAL: Well-nourished well-developed; Alert, no distress and comfortable.  Accompanied by multiple family members-wife and daughter and son. EYES: no pallor or icterus OROPHARYNX: no thrush or ulceration; good dentition  NECK: supple, no masses felt LYMPH:  no palpable lymphadenopathy in the cervical, axillary or inguinal regions LUNGS: clear to auscultation and  No wheeze or crackles HEART/CVS: regular rate & rhythm and no murmurs; No lower extremity edema ABDOMEN: abdomen soft, non-tender and normal bowel sounds Musculoskeletal:no cyanosis of digits and no clubbing  PSYCH: alert & oriented x 3 with fluent speech NEURO: no focal motor/sensory deficits SKIN:  no rashes or significant lesions  LABORATORY DATA:  I have reviewed the data as listed Lab Results  Component Value Date   WBC 6.3 09/15/2015   HGB 14.6 09/15/2015   HCT 43.4 09/15/2015   MCV 89.9 09/15/2015   PLT 241 09/15/2015    Recent Labs  07/10/15 0813 09/15/15 1424  NA 138 134*  K 4.8 3.9  CL 102 102  CO2 30 26  GLUCOSE 114* 115*  BUN 18 18  CREATININE  0.95 1.23  CALCIUM 10.2 9.8  GFRNONAA  --  58*  GFRAA  --  >60  PROT  --  7.3  ALBUMIN  --  4.2  AST  --  25  ALT  --  21  ALKPHOS  --  88  BILITOT  --  0.9    RADIOGRAPHIC STUDIES: I have personally reviewed the radiological images as listed and agreed with the findings in the report. No results found.  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 Recommend a PET scan. Recommend a EUS. We will check HER-2/neu status. I reviewed  at length that the treatment options for GE junction adenocarcinoma- would be based upon the above staging workup. If surgically amenable to resection- would recommend neoadjuvant chemoradiation therapy. If not surgically resectable/locally advanced - would recommend definitive chemoradiation therapy. No definite plans for treatment at this time. Recommend radiation oncology evaluation. Patient also has a second opinion at [facility] next week.  Check CBC CMP CEA today.  I spoke to [place]- we'll plan to get EUS at [facility] as soon as possible.  Dysphagia Secondary to esophageal cancer. Mostly to solids; recommend semisolid food/liquids.   Weight loss, abnormal Secondary dysphagia. Recommend increased protein intake. Also recommend nutrition evaluation   # Above plan of care was discussed with the patient and family in detail. He'll follow-up with me in approximately 1 week.   # 60 minutes face-to-face with the patient discussing the above plan of care; more than 50% of time spent on prognosis/ natural history; counseling and coordination.  Thank you Dr.Elliot for allowing me to participate in the care of your pleasant patient. Please do not hesitate to contact me with questions or concerns in the interim.     Cammie Sickle, MD 09/15/2015 4:57 PM

## 2015-09-16 DIAGNOSIS — C159 Malignant neoplasm of esophagus, unspecified: Secondary | ICD-10-CM | POA: Diagnosis not present

## 2015-09-16 DIAGNOSIS — R55 Syncope and collapse: Secondary | ICD-10-CM | POA: Diagnosis not present

## 2015-09-16 DIAGNOSIS — C160 Malignant neoplasm of cardia: Secondary | ICD-10-CM | POA: Diagnosis not present

## 2015-09-16 DIAGNOSIS — D62 Acute posthemorrhagic anemia: Secondary | ICD-10-CM | POA: Diagnosis not present

## 2015-09-16 DIAGNOSIS — K922 Gastrointestinal hemorrhage, unspecified: Secondary | ICD-10-CM | POA: Diagnosis not present

## 2015-09-16 LAB — CBC
HEMATOCRIT: 31.8 % — AB (ref 40.0–52.0)
HEMOGLOBIN: 10.8 g/dL — AB (ref 13.0–18.0)
MCH: 31 pg (ref 26.0–34.0)
MCHC: 34.1 g/dL (ref 32.0–36.0)
MCV: 91 fL (ref 80.0–100.0)
Platelets: 180 10*3/uL (ref 150–440)
RBC: 3.49 MIL/uL — AB (ref 4.40–5.90)
RDW: 13.2 % (ref 11.5–14.5)
WBC: 6.6 10*3/uL (ref 3.8–10.6)

## 2015-09-16 LAB — URINALYSIS COMPLETE WITH MICROSCOPIC (ARMC ONLY)
BACTERIA UA: NONE SEEN
BILIRUBIN URINE: NEGATIVE
Glucose, UA: NEGATIVE mg/dL
HGB URINE DIPSTICK: NEGATIVE
Leukocytes, UA: NEGATIVE
Nitrite: NEGATIVE
PH: 5 (ref 5.0–8.0)
PROTEIN: NEGATIVE mg/dL
SQUAMOUS EPITHELIAL / LPF: NONE SEEN
Specific Gravity, Urine: 1.019 (ref 1.005–1.030)

## 2015-09-16 LAB — BASIC METABOLIC PANEL
Anion gap: 5 (ref 5–15)
BUN: 31 mg/dL — AB (ref 6–20)
CO2: 24 mmol/L (ref 22–32)
Calcium: 8.4 mg/dL — ABNORMAL LOW (ref 8.9–10.3)
Chloride: 109 mmol/L (ref 101–111)
Creatinine, Ser: 0.94 mg/dL (ref 0.61–1.24)
Glucose, Bld: 109 mg/dL — ABNORMAL HIGH (ref 65–99)
POTASSIUM: 4.2 mmol/L (ref 3.5–5.1)
SODIUM: 138 mmol/L (ref 135–145)

## 2015-09-16 LAB — HEMOGLOBIN
HEMOGLOBIN: 11.2 g/dL — AB (ref 13.0–18.0)
HEMOGLOBIN: 11.4 g/dL — AB (ref 13.0–18.0)

## 2015-09-16 LAB — CEA: CEA: 595.7 ng/mL — AB (ref 0.0–4.7)

## 2015-09-16 MED ORDER — SODIUM CHLORIDE 0.9 % IV BOLUS (SEPSIS)
500.0000 mL | Freq: Once | INTRAVENOUS | Status: AC
  Administered 2015-09-16: 500 mL via INTRAVENOUS

## 2015-09-16 MED ORDER — SODIUM CHLORIDE 0.9 % IV SOLN
INTRAVENOUS | Status: DC
  Administered 2015-09-16 – 2015-09-17 (×2): via INTRAVENOUS

## 2015-09-16 NOTE — Consult Note (Signed)
GI Inpatient Consult Note  Reason for Consult: GI bleed   Attending Requesting Consult: Dr. Darvin Neighbours   History of Present Illness: Jason Cooper is a 70 y.o. male seen for evaluation of dysphagia at the request of Dr. Darvin Neighbours. PMHx of GEJ adenocarcinoma diagnosed on EGD last week. Admitted for syncope yesterday. Hgb down dropped about 3 grams on admission compared to his baseline.   He is seen and reports yesterday following his oncology appointment developing nausea and syncope. EMS came and systolic's in [id number]. Upon coming to ED after syncopal episode had small amount of BRB mixed into mucous (medicine cup full). He was brought to ER for evaluation. Some nausea after head CT scan but no further vomiting. No abdominal pain. He denies any recurrent hematemesis. He had a normal brown stool this AM but around 11am had a stool w/ mix of maroon and brown stool. This is first evidence of GI bleed. Denies any NSAID use except daily baby aspirin. Was tolerating liquid/soft foods over past week. Total 20lb weight loss in past 2 months. Has been on PPI BID for past 5 weeks. Planning to have EUS next Monday. Also has 2nd opinion oncology appointment at [facility] next week.    Last Colonoscopy: 2008-normal Last Endoscopy: Small-medium size fungating mass with no bleeding and no stigma of recent bleeding at GEJ, 40 cm from incisors. Partially obstructing. Not circumferential. 43 cm from teeth.    Past Medical History:  Past Medical History  Diagnosis Date  . Osteoarthritis     s/p bilat knee replacements  . History of chicken pox   . Prediabetes 2007  . Seasonal allergic rhinitis   . HTN (hypertension)   . HLD (hyperlipidemia)   . Cluster headache     as 70 yo, no more since retired (25 yrs ago)  . H/O malaria 1969    during [place] (hospitalized in [place])  . Coronary artery disease   . Diabetes mellitus without complication ([place])   . Environmental allergies   . H/O varicella   . Malaria   . Cancer  [facility])     esophageal cancer    Problem List: Patient Active Problem List   Diagnosis Date Noted  . Malignant neoplasm of lower third of esophagus ([place]) 09/15/2015  . Dysphagia 09/15/2015  . Weight loss, abnormal 09/15/2015  . GI bleed 09/15/2015  . Syncopal episodes 09/15/2015  . Subclinical hypothyroidism 07/15/2015  . Esophageal dysphagia 07/15/2015  . Advanced care planning/counseling discussion 07/11/2014  . Medicare annual wellness visit, subsequent 07/09/2013  . HTN (hypertension)   . HLD (hyperlipidemia)   . Prediabetes   . Seasonal allergic rhinitis   . Osteoarthritis     Past Surgical History: Past Surgical History  Procedure Laterality Date  . Total knee arthroplasty Bilateral 2003, 2014    right-2003; left-2014  . Ankle surgery Left 11-1999    haglin deformity [name])  . Colonoscopy  2008    WNL, rec rpt 10 yrs [name])  . Joint replacement Right 02/2002  . Esophagogastroduodenoscopy (egd) with propofol N/A 09/09/2015    Procedure: ESOPHAGOGASTRODUODENOSCOPY (EGD) WITH PROPOFOL;  Surgeon: Manya Silvas, MD;  Location: [facility];  Service: Endoscopy;  Laterality: N/A;    Allergies: No Known Allergies  Home Medications: Prescriptions prior to admission  Medication Sig Dispense Refill Last Dose  . aspirin EC 81 MG tablet Take 81 mg by mouth at bedtime.    09/14/2015 at 2100  . cetirizine (ZYRTEC) 10 MG tablet Take 10 mg by  mouth daily.   09/15/2015 at Unknown time  . cholecalciferol (VITAMIN D) 1000 units tablet Take 1,000 Units by mouth daily.   09/15/2015 at Unknown time  . ibuprofen (ADVIL,MOTRIN) 200 MG tablet Take 200 mg by mouth every 6 (six) hours as needed for headache or mild pain.    Past Month at Unknown time  . lisinopril-hydrochlorothiazide (PRINZIDE,ZESTORETIC) 20-12.5 MG per tablet Take 1 tablet by mouth daily.   09/15/2015 at Unknown time  . pantoprazole (PROTONIX) 40 MG tablet Take 40 mg by mouth 2 (two) times daily before a meal.    09/15/2015 at  Unknown time  . simvastatin (ZOCOR) 40 MG tablet Take 40 mg by mouth at bedtime.    09/14/2015 at Unknown time  . vitamin B-12 (CYANOCOBALAMIN) 1000 MCG tablet Take 2,000 mcg by mouth daily.   09/15/2015 at Unknown time   Home medication reconciliation was completed with the patient.   Scheduled Inpatient Medications:   . cholecalciferol  1,000 Units Oral Daily  . loratadine  10 mg Oral Daily  . simvastatin  40 mg Oral QHS  . vitamin B-12  2,000 mcg Oral Daily    Continuous Inpatient Infusions:   . sodium chloride    . pantoprozole (PROTONIX) infusion 8 mg/hr (09/16/15 0444)    PRN Inpatient Medications:    Family History: family history includes Cancer in his cousin, paternal aunt, paternal aunt, and sister; Diabetes in his father; Hypertension in his father and mother; Stroke in his mother and other; Thyroid disease in his mother. There is no history of CAD.    Social History:   reports that he quit smoking about 18 years ago. His smoking use included Cigarettes. He has never used smokeless tobacco. He reports that he drinks alcohol. He reports that he does not use illicit drugs.    Review of Systems: Constitutional: + weight loss  Eyes: No changes in vision. ENT: No oral lesions, sore throat.  GI: see HPI.  Heme/Lymph: No easy bruising.  CV: No chest pain.  GU: No hematuria.  Integumentary: No rashes.  Neuro: No headaches.  Psych: No depression/anxiety.  Endocrine: No heat/cold intolerance.  Allergic/Immunologic: No urticaria.  Resp: No cough, SOB.  Musculoskeletal: No joint swelling.    Physical Examination: BP 128/70 mmHg  Pulse 85  Temp(Src) 98.7 F (37.1 C) (Oral)  Resp 18  Ht 6\' 2"  (1.88 m)  Wt 114.76 kg (253 lb)  BMI 32.47 kg/m2  SpO2 99% Gen: NAD, alert and oriented x 4 HEENT: PEERLA, EOMI, Neck: supple, no JVD or thyromegaly Chest: CTA bilaterally, no wheezes, crackles, or other adventitious sounds CV: RRR, no m/g/c/r Abd: soft, NT, ND, +BS in all  four quadrants; no HSM, guarding, ridigity, or rebound tenderness Rectal-large external hemorrhoids. Maroon stool in rectal vault heme positive.  Ext: no edema, well perfused with 2+ pulses, Skin: no rash or lesions noted Lymph: no LAD  Data: Lab Results  Component Value Date   WBC 6.6 09/16/2015   HGB 11.2* 09/16/2015   HCT 31.8* 09/16/2015   MCV 91.0 09/16/2015   PLT 180 09/16/2015    Recent Labs Lab 09/15/15 2309 09/16/15 0438 09/16/15 1046  HGB 11.3* 10.8* 11.2*   Lab Results  Component Value Date   NA 138 09/16/2015   K 4.2 09/16/2015   CL 109 09/16/2015   CO2 24 09/16/2015   BUN 31* 09/16/2015   CREATININE 0.94 09/16/2015   Lab Results  Component Value Date   ALT 17 09/15/2015  AST 22 09/15/2015   ALKPHOS 60 09/15/2015   BILITOT 0.6 09/15/2015    Recent Labs Lab 09/15/15 1732  APTT 25  INR 1.21   Acute Abd/Chest 09/15/15 IMPRESSION: 1. Low lung volumes. No evidence of acute cardiopulmonary abnormality. 2. Nonobstructive bowel gas pattern and no evidence of acute intra-abdominal abnormality. Stomach moderately distended with air.  Hgb 14.6--11.4--10.3--10.8--11.2   CEA 595  Assessment/Plan: Mr. Ornellas is a 70 y.o. male   1. Anemia/GI bleed-acute blood loss. Normal MCV. Known GEJ adenocarcinoma diagnosed 1 week ago. Had both small amount of hematemesis yesterday and now maroon stools. On PPI IV gtt. 3 gram drop in hgb but now stable. Discussed option of EGD tomorrow-Dr. Gustavo Lah to further discuss w/ family possible transfer to [facility] for additional resources given vascularity of tumor and potential exacerbation of bleeding on EGD locally.   2. Esophageal adenocarcinoma - see above.   Recommendations:  1. Continue PPI IV gtt.  2. Continue NPO 3. Continue monitor H/H 4. Dr. Gustavo Lah to provide definitive recommendations.   *case discussed w/ Dr. Gustavo Lah.    Thank you for the consult. Please call with questions or concerns.  Ronney Asters,  PA-C [place]

## 2015-09-16 NOTE — Care Management Obs Status (Signed)
[facility] NOTIFICATION   Patient Details  Name: Jason Cooper MRN: [id number] Date of Birth: 1946/01/11   Medicare Observation Status Notification Given:  Yes    Beverly Sessions, RN 09/16/2015, 12:06 PM

## 2015-09-16 NOTE — Progress Notes (Addendum)
Called [facility] transfer center at 3.35 PM. Re checked again at 4 PM and advised they will call back when clinician available to discuss.  Received callback from [facility]. Discussed with Dr. Illene Labrador with hospital medicine. Accepted and placed on transfer wait list. Mentioned it may take days for transfer.  Will monitor here.

## 2015-09-16 NOTE — Progress Notes (Addendum)
[place] [facility] [place] [facility] [place] NAME: Jason Cooper    MR#:  [id number]  DATE OF BIRTH:  09-28-1945  SUBJECTIVE:  CHIEF COMPLAINT:   Chief Complaint  Patient presents with  . Loss of Consciousness  . Hematemesis   Had a bloody stool earlier today. No melena. No vomiting. No abdominal pain.  REVIEW OF SYSTEMS:    Review of Systems  Constitutional: Positive for weight loss. Negative for fever and chills.  HENT: Negative for sore throat.   Eyes: Negative for blurred vision, double vision and pain.  Respiratory: Negative for cough, hemoptysis, shortness of breath and wheezing.   Cardiovascular: Negative for chest pain, palpitations, orthopnea and leg swelling.  Gastrointestinal: Positive for blood in stool. Negative for heartburn, nausea, vomiting, abdominal pain, diarrhea and constipation.  Genitourinary: Negative for dysuria and hematuria.  Musculoskeletal: Negative for back pain and joint pain.  Skin: Negative for rash.  Neurological: Negative for sensory change, speech change, focal weakness and headaches.  Endo/Heme/Allergies: Does not bruise/bleed easily.  Psychiatric/Behavioral: Negative for depression. The patient is not nervous/anxious.    DRUG ALLERGIES:  No Known Allergies  VITALS:  Blood pressure 133/70, pulse 89, temperature 98.4 F (36.9 C), temperature source Oral, resp. rate 17, height 6\' 2"  (1.88 m), weight 114.76 kg (253 lb), SpO2 99 %.  PHYSICAL EXAMINATION:   Physical Exam  GENERAL:  70 y.o.-year-old patient lying in the bed with no acute distress.  EYES: Pupils equal, round, reactive to light and accommodation. No scleral icterus. Extraocular muscles intact.  HEENT: Head atraumatic, normocephalic. Oropharynx and nasopharynx clear.  NECK:  Supple, no jugular venous distention. No thyroid enlargement, no tenderness.  LUNGS: Normal breath sounds bilaterally, no wheezing, rales, rhonchi. No use of accessory muscles of  respiration.  CARDIOVASCULAR: S1, S2 normal. No murmurs, rubs, or gallops.  ABDOMEN: Soft, nontender, nondistended. Bowel sounds present. No organomegaly or mass.  EXTREMITIES: No cyanosis, clubbing or edema b/l.    NEUROLOGIC: Cranial nerves II through XII are intact. No focal Motor or sensory deficits b/l.   PSYCHIATRIC: The patient is alert and oriented x 3.  SKIN: No obvious rash, lesion, or ulcer.   LABORATORY PANEL:   CBC  Recent Labs Lab 09/16/15 0438 09/16/15 1046  WBC 6.6  --   HGB 10.8* 11.2*  HCT 31.8*  --   PLT 180  --   ------------------------------------------------------------------------------------------------------------------ Chemistries   Recent Labs Lab 09/15/15 1732 09/16/15 0438  NA 133* 138  K 3.6 4.2  CL 105 109  CO2 18* 24  GLUCOSE 175* 109*  BUN 19 31*  CREATININE 1.01 0.94  CALCIUM 8.4* 8.4*  MG 1.6*  --   AST 22  --   ALT 17  --   ALKPHOS 60  --   BILITOT 0.6  --   ------------------------------------------------------------------------------------------------------------------ Cardiac Enzymes  Recent Labs Lab 09/15/15 1732  TROPONINI <0.03  ------------------------------------------------------------------------------------------------------------------ RADIOLOGY:  Ct Head Wo Contrast  09/15/2015  CLINICAL DATA:  Syncope and fall.  Initial encounter. EXAM: CT HEAD WITHOUT CONTRAST TECHNIQUE: Contiguous axial images were obtained from the base of the skull through the vertex without intravenous contrast. COMPARISON:  None. FINDINGS: Skull and Sinuses:Negative for fracture or destructive process. Lucent appearance in the bilateral pterygoid body is likely arrested aeration. Visualized orbits: Negative. Brain: No evidence of acute infarction, hemorrhage, hydrocephalus, or mass lesion/mass effect. Mild generalized atrophy. IMPRESSION: No acute or posttraumatic finding. Electronically Signed   By: Neva Seat.D.  On: 09/15/2015 19:30    Dg Abd Acute W/chest  09/15/2015  CLINICAL DATA:  Nausea today, loss of consciousness, vomiting. Bloody emesis following syncope. History of hypertension, esophageal carcinoma.  Ex-smoker. EXAM: DG ABDOMEN ACUTE W/ 1V CHEST COMPARISON:  Chest x-ray dated 11/27/2006. FINDINGS: Single view of the chest: Study is hypoinspiratory. Given the low lung volumes, lungs are clear. Heart size is slightly accentuated by the low lung volumes, not significantly changed in size compared to the previous exam. No pleural effusion or pneumothorax seen. Osseous and soft tissue structures about the chest are unremarkable. Supine and upright views of the abdomen: Bowel gas pattern is nonobstructive. Stomach moderately distended with air. No evidence of soft tissue mass or abnormal fluid collection. No evidence of free intraperitoneal air. No acute-appearing osseous abnormality. IMPRESSION: 1. Low lung volumes. No evidence of acute cardiopulmonary abnormality. 2. Nonobstructive bowel gas pattern and no evidence of acute intra-abdominal abnormality. Stomach moderately distended with air. Electronically Signed   By: Franki Cabot M.D.   On: 09/15/2015 19:03   ASSESSMENT AND PLAN:   * GI bleed  Likely secondary to his esophageal cancer Continues to have blood in stool. Hemoglobin has dropped from 14-10.8. Blood pressure low normal. Continue protonix drip. We'll discussed with Dr. Gustavo Lah. Due to ongoing bleeding check stat blood pressure and hemoglobin.  * Acute blood loss anemia due to GI bleed  * Syncopal episode  Due to acute GI bleed with hypotension. Blood pressure improved. Blood pressure medications on hold.  * Esophageal cancer  Patient has followed with cancer Center and the plan is to get endoscopy ultrasound and radiation at [facility].  * Hypomagnesemia  Replaced IV.  * Hyperlipidemia  Continue atorvastatin.  Family has requested transfer to [facility]. Advised we'll wait for GI  recommendations.   Management plans discussed with the patient, family and they are in agreement.  CODE STATUS: FULL CODE  DVT Prophylaxis: SCDs  TOTAL CC TIME TAKING CARE OF THIS PATIENT: 35 minutes.   POSSIBLE D/C IN 1-2 DAYS, DEPENDING ON CLINICAL CONDITION.  Hillary Bow R M.D on 09/16/2015 at 11:48 AM  Between 7am to 6pm - Pager - [phone]  After 6pm go to [email or web address] - password [email or web address] [facility]  Office  [phone]  CC: Primary care physician; Ria Bush, MD  Note: This dictation was prepared with Dragon dictation along with smaller phrase technology. Any transcriptional errors that result from this process are unintentional.

## 2015-09-16 NOTE — Consult Note (Signed)
Please see full GI consult by Ms Constance Haw.  Patient seen and examined, chart reviewed. Patient admitted last night with n/v and hematemesis.  Known recent diagnosis of adenocarcinoma of the gastric cardia, diagnosed on bx from egd done 09/09/15.  There was partial obstruction of the GE junction at that time.  The lesion was quite friable on biopsy.  Patient has been hemodynamically stable, hgb stable for 24 hours on 4 checks.  Positive stool reported as brown with red streaks once this am 10:38 not recurrent.  Patient states no black or maroon stools pta. Patient takes a 81 mg asa daily, no nsaids.  Currently NPO.   Patient reports dysphagia for 3 months, no h/o heart burn or abdominal pain. Patient has multiple physician appointments at [facility] next week per family.    My concern is for a very friable gastric lesion, associated with the known malignant ulceration/mass per recent egd,.  Patient is currently hemodynamically stable and with a stable hemoglobin post hydration for 24 hours.  Family members are wishing for transfer to [facility].    I have discussed this with patient and his spouse and Dr Darvin Neighbours.  Agree with transfer as this would also broaden options for care to include esophagectomy as well, and may facilitate workup.  In light of current stability, will hold on repeating EGD , but will be available should the clinical situation change.

## 2015-09-17 DIAGNOSIS — C159 Malignant neoplasm of esophagus, unspecified: Secondary | ICD-10-CM | POA: Diagnosis not present

## 2015-09-17 DIAGNOSIS — K920 Hematemesis: Secondary | ICD-10-CM | POA: Diagnosis not present

## 2015-09-17 DIAGNOSIS — C160 Malignant neoplasm of cardia: Secondary | ICD-10-CM | POA: Diagnosis not present

## 2015-09-17 DIAGNOSIS — K922 Gastrointestinal hemorrhage, unspecified: Secondary | ICD-10-CM | POA: Diagnosis not present

## 2015-09-17 DIAGNOSIS — R55 Syncope and collapse: Secondary | ICD-10-CM | POA: Diagnosis not present

## 2015-09-17 LAB — BASIC METABOLIC PANEL
ANION GAP: 8 (ref 5–15)
BUN: 22 mg/dL — ABNORMAL HIGH (ref 6–20)
CALCIUM: 8.4 mg/dL — AB (ref 8.9–10.3)
CO2: 21 mmol/L — ABNORMAL LOW (ref 22–32)
CREATININE: 0.87 mg/dL (ref 0.61–1.24)
Chloride: 107 mmol/L (ref 101–111)
GFR calc non Af Amer: >60 mL/min (ref >60)
Glucose, Bld: 131 mg/dL — ABNORMAL HIGH (ref 65–99)
Potassium: 3.7 mmol/L (ref 3.5–5.1)
SODIUM: 136 mmol/L (ref 135–145)

## 2015-09-17 LAB — CBC WITH DIFFERENTIAL/PLATELET
BASOS ABS: 0 10*3/uL (ref 0–0.1)
BASOS PCT: 1 %
EOS ABS: 0.3 10*3/uL (ref 0–0.7)
Eosinophils Relative: 4 %
HEMATOCRIT: 29.3 % — AB (ref 40.0–52.0)
HEMOGLOBIN: 10 g/dL — AB (ref 13.0–18.0)
Lymphocytes Relative: 15 %
Lymphs Abs: 0.9 10*3/uL — ABNORMAL LOW (ref 1.0–3.6)
MCH: 30.2 pg (ref 26.0–34.0)
MCHC: 34 g/dL (ref 32.0–36.0)
MCV: 88.9 fL (ref 80.0–100.0)
Monocytes Absolute: 0.5 10*3/uL (ref 0.2–1.0)
Monocytes Relative: 9 %
NEUTROS ABS: 4.2 10*3/uL (ref 1.4–6.5)
NEUTROS PCT: 71 %
Platelets: 174 10*3/uL (ref 150–440)
RBC: 3.3 MIL/uL — AB (ref 4.40–5.90)
RDW: 13.6 % (ref 11.5–14.5)
WBC: 6 10*3/uL (ref 3.8–10.6)

## 2015-09-17 LAB — HEMOGLOBIN: HEMOGLOBIN: 10.1 g/dL — AB (ref 13.0–18.0)

## 2015-09-17 LAB — HEMOGLOBIN AND HEMATOCRIT, BLOOD
HCT: 32.9 % — ABNORMAL LOW (ref 40.0–52.0)
HEMOGLOBIN: 11.1 g/dL — AB (ref 13.0–18.0)

## 2015-09-17 MED ORDER — PANTOPRAZOLE SODIUM 40 MG PO TBEC
40.0000 mg | DELAYED_RELEASE_TABLET | Freq: Two times a day (BID) | ORAL | Status: DC
  Administered 2015-09-18: 40 mg via ORAL
  Filled 2015-09-17: qty 1

## 2015-09-17 NOTE — Consult Note (Signed)
Called to see patient.  Patient had a black very watery bm after a full liquid lunch.  No dizziness or abdominal pain.  DRE showing a watery effluent , thin black granular.  Of note there is a protruding hemorrhoid that is inflammed and with a small amount of blood that is bright red.     Will check hgb, consider egd depending on result,  Continue iv ppi as you are, continue obs with transfer to [facility] as feasible.   Discussed with Dr Darvin Neighbours.

## 2015-09-17 NOTE — Progress Notes (Signed)
  Oncology Nurse Navigator Documentation  Navigator Location: [facility] (09/17/15 1200)                 Barriers/Navigation Needs: Coordination of Care (09/17/15 1200)   Interventions: Coordination of Care (09/17/15 1200)   Coordination of Care: EUS (09/17/15 1200)                  Time Spent with Patient: 30 (09/17/15 1200)   Spoke with daughter Lenna Sciara, EUS has been arranged by [facility] at [facility] on Monday at 1245.

## 2015-09-17 NOTE — Consult Note (Addendum)
Labs reviewed, hemoglobin stable from this am, BUN improved from yesterday.  Will recheck hgb late tonight and in am.  Continue current.

## 2015-09-17 NOTE — Progress Notes (Signed)
[facility] [place] [facility] [place] NAME: Jason Cooper    MR#:  [id number]  DATE OF BIRTH:  06-30-1945  SUBJECTIVE:  CHIEF COMPLAINT:   Chief Complaint  Patient presents with  . Loss of Consciousness  . Hematemesis   Has not had any bowel movement since yesterday at 10 AM. No vomiting. No abdominal pain. Blood pressure and heart rate stable. No lightheadedness or weakness.  REVIEW OF SYSTEMS:    Review of Systems  Constitutional: Positive for weight loss. Negative for fever and chills.  HENT: Negative for sore throat.   Eyes: Negative for blurred vision, double vision and pain.  Respiratory: Negative for cough, hemoptysis, shortness of breath and wheezing.   Cardiovascular: Negative for chest pain, palpitations, orthopnea and leg swelling.  Gastrointestinal: Positive for blood in stool. Negative for heartburn, nausea, vomiting, abdominal pain, diarrhea and constipation.  Genitourinary: Negative for dysuria and hematuria.  Musculoskeletal: Negative for back pain and joint pain.  Skin: Negative for rash.  Neurological: Negative for sensory change, speech change, focal weakness and headaches.  Endo/Heme/Allergies: Does not bruise/bleed easily.  Psychiatric/Behavioral: Negative for depression. The patient is not nervous/anxious.    DRUG ALLERGIES:  No Known Allergies  VITALS:  Blood pressure 125/64, pulse 82, temperature 98.5 F (36.9 C), temperature source Oral, resp. rate 18, height 6\' 2"  (1.88 m), weight 114.76 kg (253 lb), SpO2 96 %.  PHYSICAL EXAMINATION:   Physical Exam  GENERAL:  70 y.o.-year-old patient lying in the bed with no acute distress.  EYES: Pupils equal, round, reactive to light and accommodation. No scleral icterus. Extraocular muscles intact.  HEENT: Head atraumatic, normocephalic. Oropharynx and nasopharynx clear.  NECK:  Supple, no jugular venous distention. No thyroid enlargement, no tenderness.  LUNGS: Normal  breath sounds bilaterally, no wheezing, rales, rhonchi. No use of accessory muscles of respiration.  CARDIOVASCULAR: S1, S2 normal. No murmurs, rubs, or gallops.  ABDOMEN: Soft, nontender, nondistended. Bowel sounds present. No organomegaly or mass.  EXTREMITIES: No cyanosis, clubbing or edema b/l.    NEUROLOGIC: Cranial nerves II through XII are intact. No focal Motor or sensory deficits b/l.   PSYCHIATRIC: The patient is alert and oriented x 3.  SKIN: No obvious rash, lesion, or ulcer.   LABORATORY PANEL:   CBC  Recent Labs Lab 09/16/15 0438  09/17/15 0504  WBC 6.6  --   --   HGB 10.8*  < > 10.1*  HCT 31.8*  --   --   PLT 180  --   --   < > = values in this interval not displayed.------------------------------------------------------------------------------------------------------------------ Chemistries   Recent Labs Lab 09/15/15 1732 09/16/15 0438  NA 133* 138  K 3.6 4.2  CL 105 109  CO2 18* 24  GLUCOSE 175* 109*  BUN 19 31*  CREATININE 1.01 0.94  CALCIUM 8.4* 8.4*  MG 1.6*  --   AST 22  --   ALT 17  --   ALKPHOS 60  --   BILITOT 0.6  --   ------------------------------------------------------------------------------------------------------------------ Cardiac Enzymes  Recent Labs Lab 09/15/15 1732  TROPONINI <0.03  ------------------------------------------------------------------------------------------------------------------ RADIOLOGY:  Ct Head Wo Contrast  09/15/2015  CLINICAL DATA:  Syncope and fall.  Initial encounter. EXAM: CT HEAD WITHOUT CONTRAST TECHNIQUE: Contiguous axial images were obtained from the base of the skull through the vertex without intravenous contrast. COMPARISON:  None. FINDINGS: Skull and Sinuses:Negative for fracture or destructive process. Lucent appearance in the bilateral pterygoid body is likely arrested aeration. Visualized  orbits: Negative. Brain: No evidence of acute infarction, hemorrhage, hydrocephalus, or mass lesion/mass  effect. Mild generalized atrophy. IMPRESSION: No acute or posttraumatic finding. Electronically Signed   By: Monte Fantasia M.D.   On: 09/15/2015 19:30   Dg Abd Acute W/chest  09/15/2015  CLINICAL DATA:  Nausea today, loss of consciousness, vomiting. Bloody emesis following syncope. History of hypertension, esophageal carcinoma.  Ex-smoker. EXAM: DG ABDOMEN ACUTE W/ 1V CHEST COMPARISON:  Chest x-ray dated 11/27/2006. FINDINGS: Single view of the chest: Study is hypoinspiratory. Given the low lung volumes, lungs are clear. Heart size is slightly accentuated by the low lung volumes, not significantly changed in size compared to the previous exam. No pleural effusion or pneumothorax seen. Osseous and soft tissue structures about the chest are unremarkable. Supine and upright views of the abdomen: Bowel gas pattern is nonobstructive. Stomach moderately distended with air. No evidence of soft tissue mass or abnormal fluid collection. No evidence of free intraperitoneal air. No acute-appearing osseous abnormality. IMPRESSION: 1. Low lung volumes. No evidence of acute cardiopulmonary abnormality. 2. Nonobstructive bowel gas pattern and no evidence of acute intra-abdominal abnormality. Stomach moderately distended with air. Electronically Signed   By: Franki Cabot M.D.   On: 09/15/2015 19:03   ASSESSMENT AND PLAN:   * GI bleed  Likely secondary to his esophageal cancer Continue protonix drip. Discussed with the [facility] Dr. Renold Genta. Patient on waiting list for transfer. Recheck with transfer center today and patient still on the waiting list. Discussed with family regarding the same. Waiting seems to have stopped her slowed down at this point. Continue protonix dtrip. Start full liquid diet. Discussed with Dr. Anastasio Champion yesterday. EGD would be difficult with his esophageal mass.   * Acute blood loss anemia due to GI bleed Transfuse as needed.  * Syncopal episode  Due to acute GI bleed with  hypotension. Blood pressure improved. Blood pressure medications on hold.  * Esophageal cancer  Patient has followed with cancer Center and the plan is to get endoscopy ultrasound and radiation at [facility].  * Hypomagnesemia  Replaced IV.  * Hyperlipidemia  Continue atorvastatin.  Family has requested transfer to [facility]. Advised we'll wait for GI recommendations.   Management plans discussed with the patient, family and they are in agreement.  CODE STATUS: FULL CODE  DVT Prophylaxis: SCDs  TOTAL TIME TAKING CARE OF THIS PATIENT: 30 minutes.   POSSIBLE D/C IN 1-2 DAYS, DEPENDING ON CLINICAL CONDITION.  Hillary Bow R M.D on 09/17/2015 at 11:18 AM  Between 7am to 6pm - Pager - [phone]  After 6pm go to [email or web address] - password [email or web address] [facility]  Office  [phone]  CC: Primary care physician; Ria Bush, MD  Note: This dictation was prepared with Dragon dictation along with smaller phrase technology. Any transcriptional errors that result from this process are unintentional.

## 2015-09-17 NOTE — Consult Note (Signed)
Subjective: Patient seen for hematemesis. Patient has done well overnight. There is no evidence of recurrent or continued bleeding. He's had no nausea or vomiting. There is no abdominal pain and there's been no bowel movement since yesterday morning. He is currently on clear liquids.  Objective: Vital signs in last 24 hours: Temp:  [97.7 F (36.5 C)-98.5 F (36.9 C)] 97.7 F (36.5 C) (06/15 1220) Pulse Rate:  [80-94] 94 (06/15 1220) Resp:  [18] 18 (06/15 1220) BP: (121-144)/(64-73) 121/73 mmHg (06/15 1220) SpO2:  [96 %-100 %] 100 % (06/15 1220) Blood pressure 121/73, pulse 94, temperature 97.7 F (36.5 C), temperature source Oral, resp. rate 18, height 6\' 2"  (1.88 m), weight 114.76 kg (253 lb), SpO2 100 %.   Intake/Output from previous day: 06/14 0701 - 06/15 0700 In: 2717.8 [P.O.:760; I.V.:1957.8] Out: -   Intake/Output this shift:     General appearance:  Well-appearing 84 male no distress Resp:  Bilaterally clear to auscultation Cardio:  Regular rate and rhythm without rub or gallop GI:  Soft nontender nondistended bowel sounds positive normoactive Extremities:  No clubbing cyanosis or edema   Lab Results: Results for orders placed or performed during the hospital encounter of 09/15/15 (from the past 24 hour(s))  Hemoglobin     Status: Abnormal   Collection Time: 09/16/15  3:58 PM  Result Value Ref Range   Hemoglobin 11.4 (L) 13.0 - 18.0 g/dL  Hemoglobin     Status: Abnormal   Collection Time: 09/17/15  5:04 AM  Result Value Ref Range   Hemoglobin 10.1 (L) 13.0 - 18.0 g/dL      Recent Labs  09/15/15 1424 09/15/15 1732  09/16/15 0438 09/16/15 1046 09/16/15 1558 09/17/15 0504  WBC 6.3 6.1  --  6.6  --   --   --   HGB 14.6 11.4*  < > 10.8* 11.2* 11.4* 10.1*  HCT 43.4 32.9*  --  31.8*  --   --   --   PLT 241 202  --  180  --   --   --   < > = values in this interval not displayed. BMET  Recent Labs  09/15/15 1424 09/15/15 1732 09/16/15 0438  NA 134*  133* 138  K 3.9 3.6 4.2  CL 102 105 109  CO2 26 18* 24  GLUCOSE 115* 175* 109*  BUN 18 19 31*  CREATININE 1.23 1.01 0.94  CALCIUM 9.8 8.4* 8.4*   LFT  Recent Labs  09/15/15 1732  PROT 5.4*  ALBUMIN 3.0*  AST 22  ALT 17  ALKPHOS 60  BILITOT 0.6   PT/INR  Recent Labs  09/15/15 1732  LABPROT 15.5*  INR 1.21   Hepatitis Panel No results for input(s): HEPBSAG, HCVAB, HEPAIGM, HEPBIGM in the last 72 hours. C-Diff No results for input(s): CDIFFTOX in the last 72 hours. No results for input(s): CDIFFPCR in the last 72 hours.   Studies/Results: Ct Head Wo Contrast  09/15/2015  CLINICAL DATA:  Syncope and fall.  Initial encounter. EXAM: CT HEAD WITHOUT CONTRAST TECHNIQUE: Contiguous axial images were obtained from the base of the skull through the vertex without intravenous contrast. COMPARISON:  None. FINDINGS: Skull and Sinuses:Negative for fracture or destructive process. Lucent appearance in the bilateral pterygoid body is likely arrested aeration. Visualized orbits: Negative. Brain: No evidence of acute infarction, hemorrhage, hydrocephalus, or mass lesion/mass effect. Mild generalized atrophy. IMPRESSION: No acute or posttraumatic finding. Electronically Signed   By: Monte Fantasia M.D.   On: 09/15/2015 19:30  Dg Abd Acute W/chest  09/15/2015  CLINICAL DATA:  Nausea today, loss of consciousness, vomiting. Bloody emesis following syncope. History of hypertension, esophageal carcinoma.  Ex-smoker. EXAM: DG ABDOMEN ACUTE W/ 1V CHEST COMPARISON:  Chest x-ray dated 11/27/2006. FINDINGS: Single view of the chest: Study is hypoinspiratory. Given the low lung volumes, lungs are clear. Heart size is slightly accentuated by the low lung volumes, not significantly changed in size compared to the previous exam. No pleural effusion or pneumothorax seen. Osseous and soft tissue structures about the chest are unremarkable. Supine and upright views of the abdomen: Bowel gas pattern is  nonobstructive. Stomach moderately distended with air. No evidence of soft tissue mass or abnormal fluid collection. No evidence of free intraperitoneal air. No acute-appearing osseous abnormality. IMPRESSION: 1. Low lung volumes. No evidence of acute cardiopulmonary abnormality. 2. Nonobstructive bowel gas pattern and no evidence of acute intra-abdominal abnormality. Stomach moderately distended with air. Electronically Signed   By: Franki Cabot M.D.   On: 09/15/2015 19:03    Scheduled Inpatient Medications:   . cholecalciferol  1,000 Units Oral Daily  . loratadine  10 mg Oral Daily  . simvastatin  40 mg Oral QHS  . vitamin B-12  2,000 mcg Oral Daily    Continuous Inpatient Infusions:   . pantoprozole (PROTONIX) infusion 8 mg/hr (09/17/15 0304)    PRN Inpatient Medications:    Miscellaneous:   Assessment:  1. 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 He has been hemodynamically stable without further evidence of recurrent bleeding.  Plan:  1. Continue IV PPI drip through this evening. At that point I would change to twice a day IV PPI in anticipation of the next changed to twice a day by mouth. Would continue clear liquids for now advanced to full liquids tomorrow as tolerated. He should be on a soft nearly edentulous diet for now until further evaluation is done as planned with [facility]. Still awaiting transfer to do however possibility being discharged home tomorrow. He will need to have close clinical follow-up in regards to rechecking hemoglobin this coming week.  Following.  Lollie Sails MD 09/17/2015, 12:39 PM

## 2015-09-18 DIAGNOSIS — C159 Malignant neoplasm of esophagus, unspecified: Secondary | ICD-10-CM | POA: Diagnosis not present

## 2015-09-18 DIAGNOSIS — K922 Gastrointestinal hemorrhage, unspecified: Secondary | ICD-10-CM | POA: Diagnosis not present

## 2015-09-18 DIAGNOSIS — R55 Syncope and collapse: Secondary | ICD-10-CM | POA: Diagnosis not present

## 2015-09-18 LAB — HEMOGLOBIN AND HEMATOCRIT, BLOOD
HCT: 29.4 % — ABNORMAL LOW (ref 40.0–52.0)
HEMOGLOBIN: 10.2 g/dL — AB (ref 13.0–18.0)

## 2015-09-18 NOTE — Discharge Instructions (Signed)
°  DIET:  Soft diet  DISCHARGE CONDITION:  Stable  ACTIVITY:  Activity as tolerated  OXYGEN:  Home Oxygen: No.   Oxygen Delivery: room air  DISCHARGE LOCATION:  home   If you experience worsening of your admission symptoms, develop shortness of breath, life threatening emergency, suicidal or homicidal thoughts you must seek medical attention immediately by calling 911 or calling your MD immediately  if symptoms less severe.  You Must read complete instructions/literature along with all the possible adverse reactions/side effects for all the Medicines you take and that have been prescribed to you. Take any new Medicines after you have completely understood and accpet all the possible adverse reactions/side effects.   Please note  You were cared for by a hospitalist during your hospital stay. If you have any questions about your discharge medications or the care you received while you were in the hospital after you are discharged, you can call the unit and asked to speak with the hospitalist on call if the hospitalist that took care of you is not available. Once you are discharged, your primary care physician will handle any further medical issues. Please note that NO REFILLS for any discharge medications will be authorized once you are discharged, as it is imperative that you return to your primary care physician (or establish a relationship with a primary care physician if you do not have one) for your aftercare needs so that they can reassess your need for medications and monitor your lab values.

## 2015-09-18 NOTE — Progress Notes (Signed)
09/18/2015 11:47 AM  BP 134/71 mmHg  Pulse 77  Temp(Src) 97.7 F (36.5 C) (Oral)  Resp 16  Ht 6\' 2"  (1.88 m)  Wt 114.76 kg (253 lb)  BMI 32.47 kg/m2  SpO2 99% Patient discharged per MD orders. Discharge instructions reviewed with patient and patient verbalized understanding. IV removed per policy.  Discharged via wheelchair escorted by auxilary.  Almedia Balls, RN

## 2015-09-21 ENCOUNTER — Ambulatory Visit
Admission: RE | Admit: 2015-09-21 | Discharge: 2015-09-21 | Disposition: A | Discharge status: Home / Self Care | Payer: Medicare Other | Source: Ambulatory Visit | Attending: Internal Medicine | Admitting: Internal Medicine

## 2015-09-21 DIAGNOSIS — C155 Malignant neoplasm of lower third of esophagus: Secondary | ICD-10-CM | POA: Diagnosis not present

## 2015-09-21 DIAGNOSIS — C772 Secondary and unspecified malignant neoplasm of intra-abdominal lymph nodes: Secondary | ICD-10-CM | POA: Insufficient documentation

## 2015-09-21 DIAGNOSIS — K228 Other specified diseases of esophagus: Secondary | ICD-10-CM | POA: Diagnosis not present

## 2015-09-21 DIAGNOSIS — R918 Other nonspecific abnormal finding of lung field: Secondary | ICD-10-CM | POA: Diagnosis not present

## 2015-09-21 DIAGNOSIS — Z7982 Long term (current) use of aspirin: Secondary | ICD-10-CM | POA: Diagnosis not present

## 2015-09-21 DIAGNOSIS — E785 Hyperlipidemia, unspecified: Secondary | ICD-10-CM | POA: Diagnosis not present

## 2015-09-21 DIAGNOSIS — Z8719 Personal history of other diseases of the digestive system: Secondary | ICD-10-CM | POA: Diagnosis not present

## 2015-09-21 DIAGNOSIS — E118 Type 2 diabetes mellitus with unspecified complications: Secondary | ICD-10-CM | POA: Diagnosis not present

## 2015-09-21 DIAGNOSIS — I898 Other specified noninfective disorders of lymphatic vessels and lymph nodes: Secondary | ICD-10-CM | POA: Diagnosis not present

## 2015-09-21 DIAGNOSIS — R591 Generalized enlarged lymph nodes: Secondary | ICD-10-CM | POA: Diagnosis not present

## 2015-09-21 DIAGNOSIS — C159 Malignant neoplasm of esophagus, unspecified: Secondary | ICD-10-CM | POA: Diagnosis not present

## 2015-09-21 DIAGNOSIS — I899 Noninfective disorder of lymphatic vessels and lymph nodes, unspecified: Secondary | ICD-10-CM | POA: Diagnosis not present

## 2015-09-21 DIAGNOSIS — C787 Secondary malignant neoplasm of liver and intrahepatic bile duct: Secondary | ICD-10-CM | POA: Diagnosis not present

## 2015-09-21 DIAGNOSIS — Z79899 Other long term (current) drug therapy: Secondary | ICD-10-CM | POA: Diagnosis not present

## 2015-09-21 DIAGNOSIS — C160 Malignant neoplasm of cardia: Secondary | ICD-10-CM | POA: Diagnosis not present

## 2015-09-21 DIAGNOSIS — R933 Abnormal findings on diagnostic imaging of other parts of digestive tract: Secondary | ICD-10-CM | POA: Diagnosis not present

## 2015-09-21 LAB — GLUCOSE, CAPILLARY: Glucose-Capillary: 92 mg/dL (ref 65–99)

## 2015-09-21 MED ORDER — FLUDEOXYGLUCOSE F - 18 (FDG) INJECTION
12.0000 | Freq: Once | INTRAVENOUS | Status: AC | PRN
  Administered 2015-09-21: 12.79 via INTRAVENOUS

## 2015-09-21 NOTE — Discharge Summary (Signed)
[facility] NAME: Jason Cooper    MR#:  [id number]  DATE OF BIRTH:  [birth year]  DATE OF ADMISSION:  09/15/2015 ADMITTING PHYSICIAN: Vaughan Basta, MD  DATE OF DISCHARGE: 09/18/2015 12:39 PM  PRIMARY CARE PHYSICIAN: Ria Bush, MD   ADMISSION DIAGNOSIS:  UGI bleed [K92.2] Syncope, unspecified syncope type [R55] Non-intractable vomiting with nausea, vomiting of unspecified type [R11.2]  DISCHARGE DIAGNOSIS:  Principal Problem:   GI bleed Active Problems:   Syncopal episodes   SECONDARY DIAGNOSIS:   Past Medical History  Diagnosis Date  . Osteoarthritis     s/p bilat knee replacements  . History of chicken pox   . Prediabetes 2007  . Seasonal allergic rhinitis   . HTN (hypertension)   . HLD (hyperlipidemia)   . Cluster headache     as 70 yo, no more since retired (25 yrs ago)  . H/O malaria 1969    during [place] (hospitalized in [place])  . Coronary artery disease   . Diabetes mellitus without complication ([place])   . Environmental allergies   . H/O varicella   . Malaria   . Cancer [facility])     esophageal cancer     ADMITTING HISTORY  HISTORY OF PRESENT ILLNESS: Jason Cooper is a 70 y.o. male with a known history of Osteoarthritis, prediabetes, hypertension, hyperlipidemia, coronary artery disease, recently found esophageal cancer ( May 2017) , seen in [place] the first time today and the plan is to start him on radiation therapy for now and referred to [facility] for EUS. After that appointment he went home and while he was at home he suddenly passed out while walking. He also had 1 vomiting which was containing SMALL amount of blood, he described it to be of a small medicine cup full. Social family called EMS and his blood pressure was running very low almost 50 systolic when they came in. In ER his hemoglobin was noted to be 3 g lower than what was in cancer Center in afternoon today. Stool  for guaiac was negative as per ER physician. Blood pressure stable in emergency room. At baseline he has some swallowing difficulty in food for last few weeks that was the reason to prompt having endoscopy last week and since then he came to know about his esophageal cancer. He lost 20 pounds weight in last 1 month.  HOSPITAL COURSE:    * GI bleed  Likely secondary to his esophageal cancer Continue protonix drip. Attempts made to transfer to [facility]. Discussed with [facility] Dr. Illene Labrador. Patient was on waiting list for transfer. Discussed with family regarding the same. Started on Protonix drip and later transitioned oral Protonix. His hemoglobin count did drop on admission but later stabilized. He had some black stools which was thought to be likely old blood. Vitals remained stable and hemoglobin stable. Adviced to discharge patient home to follow-up on Monday for endoscopy ultrasound from [facility] since his hemoglobin and vitals were stable.  * Acute blood loss anemia due to GI bleed  * Syncopal episode  Due to acute GI bleed with hypotension. Blood pressure improved.  * Esophageal cancer  Patient has followed with cancer Center and the plan is to get endoscopy ultrasound and radiation at [facility].  * Hypomagnesemia  Replaced IV.  * Hyperlipidemia  Continue atorvastatin.  Stable for discharge home has follow-up at [facility] on Monday.  CONSULTS OBTAINED:  Treatment Team:  Lollie Sails, MD  DRUG ALLERGIES:  No Known Allergies  DISCHARGE MEDICATIONS:   Discharge Medication List as of 09/18/2015 11:29 AM    CONTINUE these medications which have NOT CHANGED   Details  cetirizine (ZYRTEC) 10 MG tablet Take 10 mg by mouth daily., Until Discontinued, Historical Med    cholecalciferol (VITAMIN D) 1000 units tablet Take 1,000 Units by mouth daily., Until Discontinued, Historical Med    lisinopril-hydrochlorothiazide  (PRINZIDE,ZESTORETIC) 20-12.5 MG per tablet Take 1 tablet by mouth daily., Until Discontinued, Historical Med    pantoprazole (PROTONIX) 40 MG tablet Take 40 mg by mouth 2 (two) times daily before a meal. , Until Discontinued, Historical Med    simvastatin (ZOCOR) 40 MG tablet Take 40 mg by mouth at bedtime. , Until Discontinued, Historical Med    vitamin B-12 (CYANOCOBALAMIN) 1000 MCG tablet Take 2,000 mcg by mouth daily., Until Discontinued, Historical Med      STOP taking these medications     aspirin EC 81 MG tablet      ibuprofen (ADVIL,MOTRIN) 200 MG tablet         Today   VITAL SIGNS:  Blood pressure 134/71, pulse 77, temperature 97.7 F (36.5 C), temperature source Oral, resp. rate 16, height 6\' 2"  (1.88 m), weight 114.76 kg (253 lb), SpO2 99 %.  I/O:  No intake or output data in the 24 hours ending 09/21/15 1923  PHYSICAL EXAMINATION:  Physical Exam  GENERAL:  70 y.o.-year-old patient lying in the bed with no acute distress.  LUNGS: Normal breath sounds bilaterally, no wheezing, rales,rhonchi or crepitation. No use of accessory muscles of respiration.  CARDIOVASCULAR: S1, S2 normal. No murmurs, rubs, or gallops.  ABDOMEN: Soft, non-tender, non-distended. Bowel sounds present. No organomegaly or mass.  NEUROLOGIC: Moves all 4 extremities. PSYCHIATRIC: The patient is alert and oriented x 3.  SKIN: No obvious rash, lesion, or ulcer.   DATA REVIEW:   CBC  Recent Labs Lab 09/17/15 1357  09/18/15 0508  WBC 6.0  --   --   HGB 10.0*  < > 10.2*  HCT 29.3*  < > 29.4*  PLT 174  --   --   < > = values in this interval not displayed.  Chemistries   Recent Labs Lab 09/15/15 1732  09/17/15 1357  NA 133*  < > 136  K 3.6  < > 3.7  CL 105  < > 107  CO2 18*  < > 21*  GLUCOSE 175*  < > 131*  BUN 19  < > 22*  CREATININE 1.01  < > 0.87  CALCIUM 8.4*  < > 8.4*  MG 1.6*  --   --   AST 22  --   --   ALT 17  --   --   ALKPHOS 60  --   --   BILITOT 0.6  --   --    < > = values in this interval not displayed.  Cardiac Enzymes  Recent Labs Lab 09/15/15 1732  TROPONINI <0.03    Microbiology Results  Results for orders placed or performed in visit on 02/05/13  MRSA PCR Screening     Status: None   Collection Time: 02/05/13  9:20 AM  Result Value Ref Range Status   Micro Text Report   Final       COMMENT                   NEGATIVE - MRSA target DNA not detected  ANTIBIOTIC                                                      Urine culture     Status: None   Collection Time: 02/05/13 10:22 AM  Result Value Ref Range Status   Micro Text Report   Final       SOURCE: CLEAN CATCH    COMMENT                   NO GROWTH IN 18-24 HOURS   ANTIBIOTIC                                                        RADIOLOGY:  Nm Pet Image Initial (pi) Skull Base To Thigh  09/21/2015  CLINICAL DATA:  Initial treatment strategy for esophageal carcinoma. EXAM: NUCLEAR MEDICINE PET SKULL BASE TO THIGH TECHNIQUE: 12.8 mCi F-18 FDG was injected intravenously. Full-ring PET imaging was performed from the skull base to thigh after the radiotracer. CT data was obtained and used for attenuation correction and anatomic localization. FASTING BLOOD GLUCOSE:  Value: 92 mg/dl COMPARISON:  Esophagram 07/30/2015 FINDINGS: NECK Thyroid gland is nodular without focal metabolic activity. CHEST 5 mm nodule in the LEFT upper lobe on image 84, series 2 without metabolic activity. 4 mm LEFT lower lobe nodule image 102, series 3. Intensely hypermetabolic high RIGHT paratracheal lymph node which measures only 8 mm short axis (image 84, series 3) but with SUV max equals 16. No additional hypermetabolic mediastinal lymph nodes. There is intense metabolic activity through the most distal esophagus and gastric cardiac region / GE junction with SUV max equal 34. Hypermetabolic activity localizes to an approximately 7 cm segment of GE junction. ABDOMEN/PELVIS There are multiple round  hypermetabolic lesions within LEFT and RIGHT hepatic lobe. These lesions are represented by subtle hypodensities on comparison CT. Lesion in the central LEFT hepatic lobe measures 4 cm on image 133, series 3 with SUV max equal 10.0. Smaller lesion in the posterior RIGHT hepatic lobe with SUV max equal 25. Approximate 12 lesions in the liver. Additional non hypermetabolic cysts within the liver. There is a hypermetabolic retroperitoneal lymph node position between the IVC and the aorta measuring 13 mm on 63, series 3 with SUV max equal 20. Adjacent 7 mm hypermetabolic lymph node on same image. SKELETON No focal hypermetabolic activity to suggest skeletal metastasis. IMPRESSION: 1. Intense hypermetabolic activity through the gastroesophageal junction consists with primary esophageal carcinoma. 2. Extensive hypermetabolic hepatic metastasis. 3. Hypermetabolic paratracheal lymph node is consistent with solitary mediastinal nodal metastasis. 4. Two small LEFT lung pulmonary nodules do not have metabolic activity. Recommend attention on follow-up. 5. Hypermetabolic nodal metastasis in the retroperitoneum adjacent to the infrarenal abdominal aorta. Electronically Signed   By: Suzy Bouchard M.D.   On: 09/21/2015 10:11    Follow up with PCP in 1 week.  Management plans discussed with the patient, family and they are in agreement.  CODE STATUS:  Code Status History    Date Active Date Inactive Code Status Order ID Comments User Context   09/15/2015  9:51 PM 09/18/2015  3:39 PM Full Code [id number]  Vaughan Basta, MD ED    Advance Directive Documentation        Most Recent Value   Type of Advance Directive  Healthcare Power of Attorney, Living will   Pre-existing out of facility DNR order (yellow form or pink MOST form)     "MOST" Form in Place?        TOTAL TIME TAKING CARE OF THIS PATIENT ON DAY OF DISCHARGE: more than 30 minutes.   Hillary Bow R M.D on 09/21/2015 at 7:23 PM  Between 7am to  6pm - Pager - [phone]  After 6pm go to [email or web address] - password [email or web address] [facility]  Office  [phone]  CC: Primary care physician; Ria Bush, MD  Note: This dictation was prepared with Dragon dictation along with smaller phrase technology. Any transcriptional errors that result from this process are unintentional.

## 2015-09-22 ENCOUNTER — Ambulatory Visit: Payer: Medicare Other | Admitting: Internal Medicine

## 2015-09-22 ENCOUNTER — Telehealth: Payer: Self-pay

## 2015-09-22 DIAGNOSIS — C787 Secondary malignant neoplasm of liver and intrahepatic bile duct: Secondary | ICD-10-CM | POA: Diagnosis not present

## 2015-09-22 DIAGNOSIS — Z87891 Personal history of nicotine dependence: Secondary | ICD-10-CM | POA: Diagnosis not present

## 2015-09-22 DIAGNOSIS — C159 Malignant neoplasm of esophagus, unspecified: Secondary | ICD-10-CM | POA: Diagnosis not present

## 2015-09-22 DIAGNOSIS — C160 Malignant neoplasm of cardia: Secondary | ICD-10-CM | POA: Insufficient documentation

## 2015-09-22 DIAGNOSIS — C779 Secondary and unspecified malignant neoplasm of lymph node, unspecified: Secondary | ICD-10-CM | POA: Diagnosis not present

## 2015-09-22 DIAGNOSIS — C155 Malignant neoplasm of lower third of esophagus: Secondary | ICD-10-CM | POA: Diagnosis not present

## 2015-09-22 DIAGNOSIS — R1310 Dysphagia, unspecified: Secondary | ICD-10-CM | POA: Diagnosis not present

## 2015-09-22 NOTE — Telephone Encounter (Signed)
Transition Care Management Follow-up Telephone Call     Date discharged? 09/18/2015         How have you been since you were released from the hospital? Per spouse, pt has now been diagnosed with liver cancer.    Any patient concerns? Pt is preparing to do 5 wks of radiation and chemo at this time.    Do you understand why you were in the hospital? Yes   Do you understand the discharge instructions? Yes   Where were you discharged to? Home   Items Reviewed:  Medications reviewed: Yes, pt has discontinued ASA,Ibuprofen, and BP meds  Allergies reviewed: Yes  Dietary changes reviewed: No  Referrals reviewed: Yes    Functional Questionnaire:  Independent - I Dependent - D    Activities of Daily Living (ADLs):  Pt is independent with ADLs and iADLs per spouse  Personal hygiene  Dressing Eating  Maintaining continence Transferring  Independent Activities of Daily Living (ADLs): Basic communication skills Transportation Meal preparation  Shopping Housework  Managing medications Managing personal finances   Confirmed importance and date/time of follow-up visits scheduled NO  Provider Appointment booked with PCP - spouse has stated scheduling an appt is postponed at this time   Confirmed with patient if condition begins to worsen call PCP or go to the ER.  Patient was given the office number and encouraged to call back with question or concerns: YES

## 2015-09-23 ENCOUNTER — Ambulatory Visit: Payer: Medicare Other | Admitting: Radiation Oncology

## 2015-09-23 ENCOUNTER — Ambulatory Visit: Admission: RE | Admit: 2015-09-23 | Payer: Medicare Other | Source: Ambulatory Visit | Admitting: Radiation Oncology

## 2015-09-23 DIAGNOSIS — C155 Malignant neoplasm of lower third of esophagus: Secondary | ICD-10-CM | POA: Diagnosis not present

## 2015-09-23 DIAGNOSIS — Z79899 Other long term (current) drug therapy: Secondary | ICD-10-CM | POA: Diagnosis not present

## 2015-09-23 DIAGNOSIS — C159 Malignant neoplasm of esophagus, unspecified: Secondary | ICD-10-CM | POA: Diagnosis not present

## 2015-09-23 DIAGNOSIS — C160 Malignant neoplasm of cardia: Secondary | ICD-10-CM | POA: Diagnosis not present

## 2015-09-24 DIAGNOSIS — C155 Malignant neoplasm of lower third of esophagus: Secondary | ICD-10-CM | POA: Diagnosis not present

## 2015-09-25 DIAGNOSIS — C155 Malignant neoplasm of lower third of esophagus: Secondary | ICD-10-CM | POA: Diagnosis not present

## 2015-09-28 DIAGNOSIS — C155 Malignant neoplasm of lower third of esophagus: Secondary | ICD-10-CM | POA: Diagnosis not present

## 2015-09-28 LAB — SURGICAL PATHOLOGY

## 2015-09-29 DIAGNOSIS — C159 Malignant neoplasm of esophagus, unspecified: Secondary | ICD-10-CM | POA: Diagnosis not present

## 2015-09-29 DIAGNOSIS — C155 Malignant neoplasm of lower third of esophagus: Secondary | ICD-10-CM | POA: Diagnosis not present

## 2015-09-30 DIAGNOSIS — C155 Malignant neoplasm of lower third of esophagus: Secondary | ICD-10-CM | POA: Diagnosis not present

## 2015-10-01 ENCOUNTER — Encounter: Payer: Self-pay | Admitting: Internal Medicine

## 2015-10-01 DIAGNOSIS — Z79899 Other long term (current) drug therapy: Secondary | ICD-10-CM | POA: Diagnosis not present

## 2015-10-01 DIAGNOSIS — Z5111 Encounter for antineoplastic chemotherapy: Secondary | ICD-10-CM | POA: Diagnosis not present

## 2015-10-01 DIAGNOSIS — C155 Malignant neoplasm of lower third of esophagus: Secondary | ICD-10-CM | POA: Diagnosis not present

## 2015-10-01 DIAGNOSIS — C160 Malignant neoplasm of cardia: Secondary | ICD-10-CM | POA: Diagnosis not present

## 2015-10-02 DIAGNOSIS — C155 Malignant neoplasm of lower third of esophagus: Secondary | ICD-10-CM | POA: Diagnosis not present

## 2015-10-05 DIAGNOSIS — C155 Malignant neoplasm of lower third of esophagus: Secondary | ICD-10-CM | POA: Diagnosis not present

## 2015-10-06 ENCOUNTER — Encounter: Payer: Self-pay | Admitting: Family Medicine

## 2015-10-07 DIAGNOSIS — C155 Malignant neoplasm of lower third of esophagus: Secondary | ICD-10-CM | POA: Diagnosis not present

## 2015-10-08 DIAGNOSIS — C155 Malignant neoplasm of lower third of esophagus: Secondary | ICD-10-CM | POA: Diagnosis not present

## 2015-10-09 DIAGNOSIS — Z87891 Personal history of nicotine dependence: Secondary | ICD-10-CM | POA: Diagnosis not present

## 2015-10-09 DIAGNOSIS — Z7952 Long term (current) use of systemic steroids: Secondary | ICD-10-CM | POA: Diagnosis not present

## 2015-10-09 DIAGNOSIS — Z79899 Other long term (current) drug therapy: Secondary | ICD-10-CM | POA: Diagnosis not present

## 2015-10-09 DIAGNOSIS — C160 Malignant neoplasm of cardia: Secondary | ICD-10-CM | POA: Diagnosis not present

## 2015-10-09 DIAGNOSIS — Z5111 Encounter for antineoplastic chemotherapy: Secondary | ICD-10-CM | POA: Diagnosis not present

## 2015-10-09 DIAGNOSIS — C155 Malignant neoplasm of lower third of esophagus: Secondary | ICD-10-CM | POA: Diagnosis not present

## 2015-10-09 DIAGNOSIS — C787 Secondary malignant neoplasm of liver and intrahepatic bile duct: Secondary | ICD-10-CM | POA: Diagnosis not present

## 2015-10-12 DIAGNOSIS — C155 Malignant neoplasm of lower third of esophagus: Secondary | ICD-10-CM | POA: Diagnosis not present

## 2015-10-13 DIAGNOSIS — C155 Malignant neoplasm of lower third of esophagus: Secondary | ICD-10-CM | POA: Diagnosis not present

## 2015-10-14 DIAGNOSIS — C155 Malignant neoplasm of lower third of esophagus: Secondary | ICD-10-CM | POA: Diagnosis not present

## 2015-10-15 DIAGNOSIS — Z923 Personal history of irradiation: Secondary | ICD-10-CM | POA: Diagnosis not present

## 2015-10-15 DIAGNOSIS — Z87891 Personal history of nicotine dependence: Secondary | ICD-10-CM | POA: Diagnosis not present

## 2015-10-15 DIAGNOSIS — R918 Other nonspecific abnormal finding of lung field: Secondary | ICD-10-CM | POA: Diagnosis not present

## 2015-10-15 DIAGNOSIS — R739 Hyperglycemia, unspecified: Secondary | ICD-10-CM | POA: Diagnosis not present

## 2015-10-15 DIAGNOSIS — C155 Malignant neoplasm of lower third of esophagus: Secondary | ICD-10-CM | POA: Diagnosis not present

## 2015-10-15 DIAGNOSIS — Z79899 Other long term (current) drug therapy: Secondary | ICD-10-CM | POA: Diagnosis not present

## 2015-10-15 DIAGNOSIS — Z7952 Long term (current) use of systemic steroids: Secondary | ICD-10-CM | POA: Diagnosis not present

## 2015-10-15 DIAGNOSIS — Z5111 Encounter for antineoplastic chemotherapy: Secondary | ICD-10-CM | POA: Diagnosis not present

## 2015-10-15 DIAGNOSIS — C160 Malignant neoplasm of cardia: Secondary | ICD-10-CM | POA: Diagnosis not present

## 2015-10-16 DIAGNOSIS — C155 Malignant neoplasm of lower third of esophagus: Secondary | ICD-10-CM | POA: Diagnosis not present

## 2015-10-19 DIAGNOSIS — C155 Malignant neoplasm of lower third of esophagus: Secondary | ICD-10-CM | POA: Diagnosis not present

## 2015-10-20 DIAGNOSIS — C155 Malignant neoplasm of lower third of esophagus: Secondary | ICD-10-CM | POA: Diagnosis not present

## 2015-10-21 DIAGNOSIS — C155 Malignant neoplasm of lower third of esophagus: Secondary | ICD-10-CM | POA: Diagnosis not present

## 2015-10-22 DIAGNOSIS — Z7952 Long term (current) use of systemic steroids: Secondary | ICD-10-CM | POA: Diagnosis not present

## 2015-10-22 DIAGNOSIS — R1310 Dysphagia, unspecified: Secondary | ICD-10-CM | POA: Diagnosis not present

## 2015-10-22 DIAGNOSIS — R5383 Other fatigue: Secondary | ICD-10-CM | POA: Diagnosis not present

## 2015-10-22 DIAGNOSIS — Z79899 Other long term (current) drug therapy: Secondary | ICD-10-CM | POA: Diagnosis not present

## 2015-10-22 DIAGNOSIS — C160 Malignant neoplasm of cardia: Secondary | ICD-10-CM | POA: Diagnosis not present

## 2015-10-22 DIAGNOSIS — C775 Secondary and unspecified malignant neoplasm of intrapelvic lymph nodes: Secondary | ICD-10-CM | POA: Diagnosis not present

## 2015-10-22 DIAGNOSIS — C7989 Secondary malignant neoplasm of other specified sites: Secondary | ICD-10-CM | POA: Diagnosis not present

## 2015-10-22 DIAGNOSIS — C787 Secondary malignant neoplasm of liver and intrahepatic bile duct: Secondary | ICD-10-CM | POA: Diagnosis not present

## 2015-10-22 DIAGNOSIS — C155 Malignant neoplasm of lower third of esophagus: Secondary | ICD-10-CM | POA: Diagnosis not present

## 2015-10-22 DIAGNOSIS — Z87891 Personal history of nicotine dependence: Secondary | ICD-10-CM | POA: Diagnosis not present

## 2015-10-22 DIAGNOSIS — Z923 Personal history of irradiation: Secondary | ICD-10-CM | POA: Diagnosis not present

## 2015-10-22 DIAGNOSIS — Z5111 Encounter for antineoplastic chemotherapy: Secondary | ICD-10-CM | POA: Diagnosis not present

## 2015-10-23 DIAGNOSIS — C155 Malignant neoplasm of lower third of esophagus: Secondary | ICD-10-CM | POA: Diagnosis not present

## 2015-10-26 DIAGNOSIS — C155 Malignant neoplasm of lower third of esophagus: Secondary | ICD-10-CM | POA: Diagnosis not present

## 2015-10-27 DIAGNOSIS — C155 Malignant neoplasm of lower third of esophagus: Secondary | ICD-10-CM | POA: Diagnosis not present

## 2015-10-28 DIAGNOSIS — C155 Malignant neoplasm of lower third of esophagus: Secondary | ICD-10-CM | POA: Diagnosis not present

## 2015-10-30 ENCOUNTER — Telehealth: Payer: Self-pay | Admitting: Family Medicine

## 2015-10-30 NOTE — Telephone Encounter (Signed)
Spoke with patient to touch base on treatment to date. Has completed 22 radiation treatments and 4 chemo treatments at [facility] for esophageal adenocarcinoma. Now has break for next 3 wks and will undergo re-staging studies.  plz cancel thyroid lab visit at this time.

## 2015-11-02 NOTE — Telephone Encounter (Signed)
Appt cancelled

## 2015-11-18 ENCOUNTER — Other Ambulatory Visit: Payer: Medicare Other

## 2015-11-20 DIAGNOSIS — Z87891 Personal history of nicotine dependence: Secondary | ICD-10-CM | POA: Diagnosis not present

## 2015-11-20 DIAGNOSIS — Z86711 Personal history of pulmonary embolism: Secondary | ICD-10-CM | POA: Diagnosis not present

## 2015-11-20 DIAGNOSIS — Z79899 Other long term (current) drug therapy: Secondary | ICD-10-CM | POA: Diagnosis not present

## 2015-11-20 DIAGNOSIS — Z923 Personal history of irradiation: Secondary | ICD-10-CM | POA: Diagnosis not present

## 2015-11-20 DIAGNOSIS — C787 Secondary malignant neoplasm of liver and intrahepatic bile duct: Secondary | ICD-10-CM | POA: Diagnosis not present

## 2015-11-20 DIAGNOSIS — R918 Other nonspecific abnormal finding of lung field: Secondary | ICD-10-CM | POA: Diagnosis not present

## 2015-11-20 DIAGNOSIS — C159 Malignant neoplasm of esophagus, unspecified: Secondary | ICD-10-CM | POA: Diagnosis not present

## 2015-11-20 DIAGNOSIS — C160 Malignant neoplasm of cardia: Secondary | ICD-10-CM | POA: Diagnosis not present

## 2015-11-20 DIAGNOSIS — I2699 Other pulmonary embolism without acute cor pulmonale: Secondary | ICD-10-CM | POA: Diagnosis not present

## 2015-11-20 DIAGNOSIS — C772 Secondary and unspecified malignant neoplasm of intra-abdominal lymph nodes: Secondary | ICD-10-CM | POA: Diagnosis not present

## 2015-11-20 DIAGNOSIS — Z5111 Encounter for antineoplastic chemotherapy: Secondary | ICD-10-CM | POA: Diagnosis not present

## 2015-11-28 ENCOUNTER — Encounter: Payer: Self-pay | Admitting: Family Medicine

## 2015-11-28 DIAGNOSIS — I2699 Other pulmonary embolism without acute cor pulmonale: Secondary | ICD-10-CM

## 2015-11-28 HISTORY — DX: Other pulmonary embolism without acute cor pulmonale: I26.99

## 2015-12-03 DIAGNOSIS — C160 Malignant neoplasm of cardia: Secondary | ICD-10-CM | POA: Diagnosis not present

## 2015-12-03 DIAGNOSIS — C159 Malignant neoplasm of esophagus, unspecified: Secondary | ICD-10-CM | POA: Diagnosis not present

## 2015-12-03 DIAGNOSIS — I517 Cardiomegaly: Secondary | ICD-10-CM | POA: Diagnosis not present

## 2015-12-03 DIAGNOSIS — R931 Abnormal findings on diagnostic imaging of heart and coronary circulation: Secondary | ICD-10-CM | POA: Diagnosis not present

## 2015-12-10 DIAGNOSIS — Z86711 Personal history of pulmonary embolism: Secondary | ICD-10-CM | POA: Diagnosis not present

## 2015-12-10 DIAGNOSIS — C160 Malignant neoplasm of cardia: Secondary | ICD-10-CM | POA: Diagnosis not present

## 2015-12-10 DIAGNOSIS — C787 Secondary malignant neoplasm of liver and intrahepatic bile duct: Secondary | ICD-10-CM | POA: Diagnosis not present

## 2015-12-10 DIAGNOSIS — I10 Essential (primary) hypertension: Secondary | ICD-10-CM | POA: Diagnosis not present

## 2015-12-10 DIAGNOSIS — Z5112 Encounter for antineoplastic immunotherapy: Secondary | ICD-10-CM | POA: Diagnosis not present

## 2015-12-10 DIAGNOSIS — R030 Elevated blood-pressure reading, without diagnosis of hypertension: Secondary | ICD-10-CM | POA: Diagnosis not present

## 2015-12-10 DIAGNOSIS — Z87891 Personal history of nicotine dependence: Secondary | ICD-10-CM | POA: Diagnosis not present

## 2015-12-22 DIAGNOSIS — C160 Malignant neoplasm of cardia: Secondary | ICD-10-CM | POA: Diagnosis not present

## 2015-12-22 DIAGNOSIS — Z87891 Personal history of nicotine dependence: Secondary | ICD-10-CM | POA: Diagnosis not present

## 2015-12-22 DIAGNOSIS — Z5112 Encounter for antineoplastic immunotherapy: Secondary | ICD-10-CM | POA: Diagnosis not present

## 2015-12-22 DIAGNOSIS — C787 Secondary malignant neoplasm of liver and intrahepatic bile duct: Secondary | ICD-10-CM | POA: Diagnosis not present

## 2015-12-22 DIAGNOSIS — Z5111 Encounter for antineoplastic chemotherapy: Secondary | ICD-10-CM | POA: Diagnosis not present

## 2016-01-04 DIAGNOSIS — Z5112 Encounter for antineoplastic immunotherapy: Secondary | ICD-10-CM | POA: Diagnosis not present

## 2016-01-04 DIAGNOSIS — C787 Secondary malignant neoplasm of liver and intrahepatic bile duct: Secondary | ICD-10-CM | POA: Diagnosis not present

## 2016-01-04 DIAGNOSIS — Z23 Encounter for immunization: Secondary | ICD-10-CM | POA: Diagnosis not present

## 2016-01-04 DIAGNOSIS — Z87891 Personal history of nicotine dependence: Secondary | ICD-10-CM | POA: Diagnosis not present

## 2016-01-04 DIAGNOSIS — C772 Secondary and unspecified malignant neoplasm of intra-abdominal lymph nodes: Secondary | ICD-10-CM | POA: Diagnosis not present

## 2016-01-04 DIAGNOSIS — C160 Malignant neoplasm of cardia: Secondary | ICD-10-CM | POA: Diagnosis not present

## 2016-01-04 DIAGNOSIS — Z5111 Encounter for antineoplastic chemotherapy: Secondary | ICD-10-CM | POA: Diagnosis not present

## 2016-01-06 ENCOUNTER — Other Ambulatory Visit: Payer: Self-pay | Admitting: Family Medicine

## 2016-01-06 DIAGNOSIS — R7303 Prediabetes: Secondary | ICD-10-CM

## 2016-01-14 ENCOUNTER — Other Ambulatory Visit (INDEPENDENT_AMBULATORY_CARE_PROVIDER_SITE_OTHER): Payer: Medicare Other

## 2016-01-14 DIAGNOSIS — E038 Other specified hypothyroidism: Secondary | ICD-10-CM

## 2016-01-14 DIAGNOSIS — R7303 Prediabetes: Secondary | ICD-10-CM

## 2016-01-14 DIAGNOSIS — E039 Hypothyroidism, unspecified: Secondary | ICD-10-CM | POA: Diagnosis not present

## 2016-01-14 LAB — T4, FREE: Free T4: 0.86 ng/dL (ref 0.60–1.60)

## 2016-01-14 LAB — HEMOGLOBIN A1C: Hgb A1c MFr Bld: 6.2 % (ref 4.6–6.5)

## 2016-01-14 LAB — TSH: TSH: 0.17 u[IU]/mL — AB (ref 0.35–4.50)

## 2016-01-15 LAB — T3: T3 TOTAL: 149 ng/dL (ref 76–181)

## 2016-01-16 ENCOUNTER — Encounter: Payer: Self-pay | Admitting: Family Medicine

## 2016-01-19 DIAGNOSIS — C160 Malignant neoplasm of cardia: Secondary | ICD-10-CM | POA: Diagnosis not present

## 2016-01-19 DIAGNOSIS — Z5112 Encounter for antineoplastic immunotherapy: Secondary | ICD-10-CM | POA: Diagnosis not present

## 2016-01-19 DIAGNOSIS — Z923 Personal history of irradiation: Secondary | ICD-10-CM | POA: Diagnosis not present

## 2016-01-19 DIAGNOSIS — Z5111 Encounter for antineoplastic chemotherapy: Secondary | ICD-10-CM | POA: Diagnosis not present

## 2016-01-19 DIAGNOSIS — Z87891 Personal history of nicotine dependence: Secondary | ICD-10-CM | POA: Diagnosis not present

## 2016-01-19 DIAGNOSIS — C787 Secondary malignant neoplasm of liver and intrahepatic bile duct: Secondary | ICD-10-CM | POA: Diagnosis not present

## 2016-02-01 DIAGNOSIS — Z7901 Long term (current) use of anticoagulants: Secondary | ICD-10-CM | POA: Diagnosis not present

## 2016-02-01 DIAGNOSIS — Z87891 Personal history of nicotine dependence: Secondary | ICD-10-CM | POA: Diagnosis not present

## 2016-02-01 DIAGNOSIS — Z86711 Personal history of pulmonary embolism: Secondary | ICD-10-CM | POA: Diagnosis not present

## 2016-02-01 DIAGNOSIS — E041 Nontoxic single thyroid nodule: Secondary | ICD-10-CM | POA: Diagnosis not present

## 2016-02-01 DIAGNOSIS — Z5112 Encounter for antineoplastic immunotherapy: Secondary | ICD-10-CM | POA: Diagnosis not present

## 2016-02-01 DIAGNOSIS — C160 Malignant neoplasm of cardia: Secondary | ICD-10-CM | POA: Diagnosis not present

## 2016-02-01 DIAGNOSIS — Z5111 Encounter for antineoplastic chemotherapy: Secondary | ICD-10-CM | POA: Diagnosis not present

## 2016-02-01 DIAGNOSIS — R5383 Other fatigue: Secondary | ICD-10-CM | POA: Diagnosis not present

## 2016-02-01 DIAGNOSIS — C787 Secondary malignant neoplasm of liver and intrahepatic bile duct: Secondary | ICD-10-CM | POA: Diagnosis not present

## 2016-02-01 DIAGNOSIS — G629 Polyneuropathy, unspecified: Secondary | ICD-10-CM | POA: Diagnosis not present

## 2016-02-01 DIAGNOSIS — Z9889 Other specified postprocedural states: Secondary | ICD-10-CM | POA: Diagnosis not present

## 2016-02-01 DIAGNOSIS — D709 Neutropenia, unspecified: Secondary | ICD-10-CM | POA: Diagnosis not present

## 2016-02-15 DIAGNOSIS — Z5112 Encounter for antineoplastic immunotherapy: Secondary | ICD-10-CM | POA: Diagnosis not present

## 2016-02-15 DIAGNOSIS — C160 Malignant neoplasm of cardia: Secondary | ICD-10-CM | POA: Diagnosis not present

## 2016-02-15 DIAGNOSIS — Z5111 Encounter for antineoplastic chemotherapy: Secondary | ICD-10-CM | POA: Diagnosis not present

## 2016-02-29 DIAGNOSIS — Z5112 Encounter for antineoplastic immunotherapy: Secondary | ICD-10-CM | POA: Diagnosis not present

## 2016-02-29 DIAGNOSIS — R931 Abnormal findings on diagnostic imaging of heart and coronary circulation: Secondary | ICD-10-CM | POA: Diagnosis not present

## 2016-02-29 DIAGNOSIS — C772 Secondary and unspecified malignant neoplasm of intra-abdominal lymph nodes: Secondary | ICD-10-CM | POA: Diagnosis not present

## 2016-02-29 DIAGNOSIS — C160 Malignant neoplasm of cardia: Secondary | ICD-10-CM | POA: Diagnosis not present

## 2016-02-29 DIAGNOSIS — C787 Secondary malignant neoplasm of liver and intrahepatic bile duct: Secondary | ICD-10-CM | POA: Diagnosis not present

## 2016-02-29 DIAGNOSIS — I517 Cardiomegaly: Secondary | ICD-10-CM | POA: Diagnosis not present

## 2016-02-29 DIAGNOSIS — Z87891 Personal history of nicotine dependence: Secondary | ICD-10-CM | POA: Diagnosis not present

## 2016-02-29 DIAGNOSIS — Z5111 Encounter for antineoplastic chemotherapy: Secondary | ICD-10-CM | POA: Diagnosis not present

## 2016-03-14 DIAGNOSIS — C160 Malignant neoplasm of cardia: Secondary | ICD-10-CM | POA: Diagnosis not present

## 2016-03-14 DIAGNOSIS — Z5111 Encounter for antineoplastic chemotherapy: Secondary | ICD-10-CM | POA: Diagnosis not present

## 2016-03-14 DIAGNOSIS — Z5112 Encounter for antineoplastic immunotherapy: Secondary | ICD-10-CM | POA: Diagnosis not present

## 2016-03-31 DIAGNOSIS — Z5112 Encounter for antineoplastic immunotherapy: Secondary | ICD-10-CM | POA: Diagnosis not present

## 2016-03-31 DIAGNOSIS — Z87891 Personal history of nicotine dependence: Secondary | ICD-10-CM | POA: Diagnosis not present

## 2016-03-31 DIAGNOSIS — J90 Pleural effusion, not elsewhere classified: Secondary | ICD-10-CM | POA: Diagnosis not present

## 2016-03-31 DIAGNOSIS — J9811 Atelectasis: Secondary | ICD-10-CM | POA: Diagnosis not present

## 2016-03-31 DIAGNOSIS — C159 Malignant neoplasm of esophagus, unspecified: Secondary | ICD-10-CM | POA: Diagnosis not present

## 2016-03-31 DIAGNOSIS — D649 Anemia, unspecified: Secondary | ICD-10-CM | POA: Diagnosis not present

## 2016-03-31 DIAGNOSIS — I313 Pericardial effusion (noninflammatory): Secondary | ICD-10-CM | POA: Diagnosis not present

## 2016-03-31 DIAGNOSIS — R918 Other nonspecific abnormal finding of lung field: Secondary | ICD-10-CM | POA: Diagnosis not present

## 2016-03-31 DIAGNOSIS — Z923 Personal history of irradiation: Secondary | ICD-10-CM | POA: Diagnosis not present

## 2016-03-31 DIAGNOSIS — C160 Malignant neoplasm of cardia: Secondary | ICD-10-CM | POA: Diagnosis not present

## 2016-03-31 DIAGNOSIS — K229 Disease of esophagus, unspecified: Secondary | ICD-10-CM | POA: Diagnosis not present

## 2016-04-19 DIAGNOSIS — Z923 Personal history of irradiation: Secondary | ICD-10-CM | POA: Diagnosis not present

## 2016-04-19 DIAGNOSIS — Z5112 Encounter for antineoplastic immunotherapy: Secondary | ICD-10-CM | POA: Diagnosis not present

## 2016-04-19 DIAGNOSIS — Z87891 Personal history of nicotine dependence: Secondary | ICD-10-CM | POA: Diagnosis not present

## 2016-04-19 DIAGNOSIS — C160 Malignant neoplasm of cardia: Secondary | ICD-10-CM | POA: Diagnosis not present

## 2016-04-19 DIAGNOSIS — Z86711 Personal history of pulmonary embolism: Secondary | ICD-10-CM | POA: Diagnosis not present

## 2016-04-19 DIAGNOSIS — C787 Secondary malignant neoplasm of liver and intrahepatic bile duct: Secondary | ICD-10-CM | POA: Diagnosis not present

## 2016-04-19 DIAGNOSIS — D509 Iron deficiency anemia, unspecified: Secondary | ICD-10-CM | POA: Diagnosis not present

## 2016-04-25 LAB — HM DIABETES EYE EXAM

## 2016-05-02 DIAGNOSIS — Z87891 Personal history of nicotine dependence: Secondary | ICD-10-CM | POA: Diagnosis not present

## 2016-05-02 DIAGNOSIS — C160 Malignant neoplasm of cardia: Secondary | ICD-10-CM | POA: Diagnosis not present

## 2016-05-02 DIAGNOSIS — Z5112 Encounter for antineoplastic immunotherapy: Secondary | ICD-10-CM | POA: Diagnosis not present

## 2016-05-02 DIAGNOSIS — C787 Secondary malignant neoplasm of liver and intrahepatic bile duct: Secondary | ICD-10-CM | POA: Diagnosis not present

## 2016-05-02 DIAGNOSIS — C786 Secondary malignant neoplasm of retroperitoneum and peritoneum: Secondary | ICD-10-CM | POA: Diagnosis not present

## 2016-05-16 DIAGNOSIS — Z5112 Encounter for antineoplastic immunotherapy: Secondary | ICD-10-CM | POA: Diagnosis not present

## 2016-05-16 DIAGNOSIS — C160 Malignant neoplasm of cardia: Secondary | ICD-10-CM | POA: Diagnosis not present

## 2016-05-30 DIAGNOSIS — R911 Solitary pulmonary nodule: Secondary | ICD-10-CM | POA: Diagnosis not present

## 2016-05-30 DIAGNOSIS — C787 Secondary malignant neoplasm of liver and intrahepatic bile duct: Secondary | ICD-10-CM | POA: Diagnosis not present

## 2016-05-30 DIAGNOSIS — J90 Pleural effusion, not elsewhere classified: Secondary | ICD-10-CM | POA: Diagnosis not present

## 2016-05-30 DIAGNOSIS — C160 Malignant neoplasm of cardia: Secondary | ICD-10-CM | POA: Diagnosis not present

## 2016-05-30 DIAGNOSIS — Z87891 Personal history of nicotine dependence: Secondary | ICD-10-CM | POA: Diagnosis not present

## 2016-05-30 DIAGNOSIS — Z5112 Encounter for antineoplastic immunotherapy: Secondary | ICD-10-CM | POA: Diagnosis not present

## 2016-06-13 DIAGNOSIS — C787 Secondary malignant neoplasm of liver and intrahepatic bile duct: Secondary | ICD-10-CM | POA: Diagnosis not present

## 2016-06-13 DIAGNOSIS — C160 Malignant neoplasm of cardia: Secondary | ICD-10-CM | POA: Diagnosis not present

## 2016-06-13 DIAGNOSIS — G629 Polyneuropathy, unspecified: Secondary | ICD-10-CM | POA: Diagnosis not present

## 2016-06-13 DIAGNOSIS — C772 Secondary and unspecified malignant neoplasm of intra-abdominal lymph nodes: Secondary | ICD-10-CM | POA: Diagnosis not present

## 2016-06-13 DIAGNOSIS — Z923 Personal history of irradiation: Secondary | ICD-10-CM | POA: Diagnosis not present

## 2016-06-13 DIAGNOSIS — Z87891 Personal history of nicotine dependence: Secondary | ICD-10-CM | POA: Diagnosis not present

## 2016-06-13 DIAGNOSIS — Z5112 Encounter for antineoplastic immunotherapy: Secondary | ICD-10-CM | POA: Diagnosis not present

## 2016-06-27 ENCOUNTER — Telehealth: Payer: Self-pay

## 2016-06-27 DIAGNOSIS — I517 Cardiomegaly: Secondary | ICD-10-CM | POA: Diagnosis not present

## 2016-06-27 DIAGNOSIS — I371 Nonrheumatic pulmonary valve insufficiency: Secondary | ICD-10-CM | POA: Diagnosis not present

## 2016-06-27 DIAGNOSIS — C160 Malignant neoplasm of cardia: Secondary | ICD-10-CM | POA: Diagnosis not present

## 2016-06-27 DIAGNOSIS — I340 Nonrheumatic mitral (valve) insufficiency: Secondary | ICD-10-CM | POA: Diagnosis not present

## 2016-06-27 DIAGNOSIS — Z5112 Encounter for antineoplastic immunotherapy: Secondary | ICD-10-CM | POA: Diagnosis not present

## 2016-06-27 DIAGNOSIS — R931 Abnormal findings on diagnostic imaging of heart and coronary circulation: Secondary | ICD-10-CM | POA: Diagnosis not present

## 2016-06-27 NOTE — Telephone Encounter (Signed)
PLEASE NOTE: All timestamps contained within this report are represented as [place] Standard Time. CONFIDENTIALTY NOTICE: This fax transmission is intended only for the addressee. It contains information that is legally privileged, confidential or otherwise protected from use or disclosure. If you are not the intended recipient, you are strictly prohibited from reviewing, disclosing, copying using or disseminating any of this information or taking any action in reliance on or regarding this information. If you have received this fax in error, please notify us immediately by telephone so that we can arrange for its return to [place]. Phone: [phone], Toll-Free: [phone], Fax: [phone] Page: 1 of 1 Call Id: [id number] [place] Patient Name: Jason Cooper Gender: Male DOB: 1945-12-20 Age: 71 Y 9 M 23 D Return Phone Number: [phone] (Primary), [phone] (Secondary) City/State/Zip: [place] Client [place] Night - Client Client Site [facility] Physician Ria Bush - MD Who Is Calling Patient / Member / Family / Caregiver Call Type Triage / Clinical Relationship To Patient Self Return Phone Number [phone] (Primary) Chief Complaint Flu Symptom Reason for Call Symptomatic / Request for [place] states he is currently undergoing cancer treatment at [facility]. He has been exposed to the flu, wondering if he can get Tamiflu. Blood count is below normal. Nurse Assessment Nurse: Lavera Guise RN, Vaughan Basta Date/Time (Eastern Time): 06/26/2016 1:51:33 PM Confirm and document reason for call. If symptomatic, describe symptoms. ---Caller states he is currently undergoing cancer treatment at [facility], he has been exposed to the flu, wondering if he can get Tamiflu. Blood count is below normal. Nurse triage not  indicated. Patient just needs script called in. Please document clinical information provided and list any resource used. ---Nurse triage not indicated. Patient just needs script called in. Guidelines Guideline Title Affirmed Question Disp. Time Eilene Ghazi Time) Disposition Final User 06/26/2016 1:56:24 PM Clinical Call Yes Lavera Guise, RN, Vaughan Basta Comments User: Ricard Dillon, RN Date/Time Eilene Ghazi Time): 06/26/2016 1:56:18 PM Called patient back to confirm medication had been called in to the pharmacy. Paging DoctorName Phone DateTime Action Result/Outcome Notes Carolann Littler MD [phone] 06/26/2016 1:54:36 PM Doctor Paged Paged On Call Back to Call Center Carolann Littler - MD 06/26/2016 1:55:05 PM Message Result Spoke with On Call - General Dr. states: Call in Tamiflu 75 mg 1x daily x 10 days.#10 No refills.

## 2016-07-11 DIAGNOSIS — Z923 Personal history of irradiation: Secondary | ICD-10-CM | POA: Diagnosis not present

## 2016-07-11 DIAGNOSIS — L603 Nail dystrophy: Secondary | ICD-10-CM | POA: Diagnosis not present

## 2016-07-11 DIAGNOSIS — G629 Polyneuropathy, unspecified: Secondary | ICD-10-CM | POA: Diagnosis not present

## 2016-07-11 DIAGNOSIS — C160 Malignant neoplasm of cardia: Secondary | ICD-10-CM | POA: Diagnosis not present

## 2016-07-11 DIAGNOSIS — R600 Localized edema: Secondary | ICD-10-CM | POA: Diagnosis not present

## 2016-07-11 DIAGNOSIS — Z5112 Encounter for antineoplastic immunotherapy: Secondary | ICD-10-CM | POA: Diagnosis not present

## 2016-07-11 DIAGNOSIS — C787 Secondary malignant neoplasm of liver and intrahepatic bile duct: Secondary | ICD-10-CM | POA: Diagnosis not present

## 2016-07-11 DIAGNOSIS — Z87891 Personal history of nicotine dependence: Secondary | ICD-10-CM | POA: Diagnosis not present

## 2016-07-11 DIAGNOSIS — C772 Secondary and unspecified malignant neoplasm of intra-abdominal lymph nodes: Secondary | ICD-10-CM | POA: Diagnosis not present

## 2016-07-11 DIAGNOSIS — K1230 Oral mucositis (ulcerative), unspecified: Secondary | ICD-10-CM | POA: Diagnosis not present

## 2016-07-11 LAB — COMPREHENSIVE METABOLIC PANEL
ALT: 28
AST: 29 U/L
Alkaline Phosphatase: 72 U/L
CREATININE: 1.1
Glucose: 136
POTASSIUM: 4.1 mmol/L
Sodium: 139
Total Bilirubin: 0.6 mg/dL

## 2016-07-11 LAB — CBC
HEMATOCRIT: 38 %
HGB: 12.1 g/dL
PLATELET COUNT: 195
WBC: 3.9

## 2016-07-22 ENCOUNTER — Ambulatory Visit (INDEPENDENT_AMBULATORY_CARE_PROVIDER_SITE_OTHER): Payer: Medicare Other

## 2016-07-22 ENCOUNTER — Other Ambulatory Visit: Payer: Self-pay | Admitting: Family Medicine

## 2016-07-22 VITALS — BP 120/70 | HR 88 | Temp 97.9°F | Ht 74.0 in | Wt 258.8 lb

## 2016-07-22 DIAGNOSIS — Z0000 Encounter for general adult medical examination without abnormal findings: Secondary | ICD-10-CM | POA: Diagnosis not present

## 2016-07-22 DIAGNOSIS — Z125 Encounter for screening for malignant neoplasm of prostate: Secondary | ICD-10-CM | POA: Diagnosis not present

## 2016-07-22 DIAGNOSIS — D649 Anemia, unspecified: Secondary | ICD-10-CM

## 2016-07-22 DIAGNOSIS — E538 Deficiency of other specified B group vitamins: Secondary | ICD-10-CM | POA: Diagnosis not present

## 2016-07-22 DIAGNOSIS — E785 Hyperlipidemia, unspecified: Secondary | ICD-10-CM

## 2016-07-22 DIAGNOSIS — R7303 Prediabetes: Secondary | ICD-10-CM

## 2016-07-22 DIAGNOSIS — E0590 Thyrotoxicosis, unspecified without thyrotoxic crisis or storm: Secondary | ICD-10-CM | POA: Diagnosis not present

## 2016-07-22 DIAGNOSIS — C155 Malignant neoplasm of lower third of esophagus: Secondary | ICD-10-CM

## 2016-07-22 LAB — COMPREHENSIVE METABOLIC PANEL
ALT: 26 U/L (ref 0–53)
AST: 22 U/L (ref 0–37)
Albumin: 4 g/dL (ref 3.5–5.2)
Alkaline Phosphatase: 80 U/L (ref 39–117)
BUN: 18 mg/dL (ref 6–23)
CHLORIDE: 104 meq/L (ref 96–112)
CO2: 30 meq/L (ref 19–32)
Calcium: 9.8 mg/dL (ref 8.4–10.5)
Creatinine, Ser: 1.12 mg/dL (ref 0.40–1.50)
GFR: 68.91 mL/min (ref >60.00)
Glucose, Bld: 98 mg/dL (ref 70–99)
POTASSIUM: 4.5 meq/L (ref 3.5–5.1)
SODIUM: 140 meq/L (ref 135–145)
Total Bilirubin: 0.6 mg/dL (ref 0.2–1.2)
Total Protein: 6.4 g/dL (ref 6.0–8.3)

## 2016-07-22 LAB — CBC WITH DIFFERENTIAL/PLATELET
BASOS ABS: 0.1 10*3/uL (ref 0.0–0.1)
BASOS PCT: 1.2 % (ref 0.0–3.0)
EOS ABS: 0.3 10*3/uL (ref 0.0–0.7)
Eosinophils Relative: 5.7 % — ABNORMAL HIGH (ref 0.0–5.0)
HCT: 42.1 % (ref 39.0–52.0)
HEMOGLOBIN: 13.7 g/dL (ref 13.0–17.0)
LYMPHS PCT: 13.6 % (ref 12.0–46.0)
Lymphs Abs: 0.7 10*3/uL (ref 0.7–4.0)
MCHC: 32.6 g/dL (ref 30.0–36.0)
MCV: 93.3 fl (ref 78.0–100.0)
Monocytes Absolute: 0.9 10*3/uL (ref 0.1–1.0)
Monocytes Relative: 18.2 % — ABNORMAL HIGH (ref 3.0–12.0)
Neutro Abs: 3.1 10*3/uL (ref 1.4–7.7)
Neutrophils Relative %: 61.3 % (ref 43.0–77.0)
Platelets: 219 10*3/uL (ref 150.0–400.0)
RBC: 4.51 Mil/uL (ref 4.22–5.81)
RDW: 20.4 % — ABNORMAL HIGH (ref 11.5–15.5)
WBC: 5.1 10*3/uL (ref 4.0–10.5)

## 2016-07-22 LAB — LIPID PANEL
CHOL/HDL RATIO: 4
CHOLESTEROL: 185 mg/dL (ref 0–200)
HDL: 52.6 mg/dL (ref >39.00)
LDL CALC: 108 mg/dL — AB (ref 0–99)
NONHDL: 132.78
Triglycerides: 122 mg/dL (ref 0.0–149.0)
VLDL: 24.4 mg/dL (ref 0.0–40.0)

## 2016-07-22 LAB — PSA, MEDICARE: PSA: 5.55 ng/mL — AB (ref 0.10–4.00)

## 2016-07-22 LAB — FERRITIN: Ferritin: 31.8 ng/mL (ref 22.0–322.0)

## 2016-07-22 LAB — VITAMIN B12: Vitamin B-12: 950 pg/mL — ABNORMAL HIGH (ref 211–911)

## 2016-07-22 LAB — IBC PANEL
IRON: 106 ug/dL (ref 42–165)
SATURATION RATIOS: 24.3 % (ref 20.0–50.0)
Transferrin: 312 mg/dL (ref 212.0–360.0)

## 2016-07-22 LAB — HEMOGLOBIN A1C: Hgb A1c MFr Bld: 6.8 % — ABNORMAL HIGH (ref 4.6–6.5)

## 2016-07-22 LAB — TSH: TSH: 0.33 u[IU]/mL — AB (ref 0.35–4.50)

## 2016-07-22 LAB — T4, FREE: Free T4: 1.03 ng/dL (ref 0.60–1.60)

## 2016-07-22 NOTE — Progress Notes (Signed)
PCP notes:   Health maintenance:  Colonoscopy - PCP will address at next appt  Abnormal screenings:   Hearing - failed  Patient concerns:   None  Nurse concerns:  None  Next PCP appt:   07/29/16 @ 0830

## 2016-07-22 NOTE — Patient Instructions (Signed)
Jason Cooper , Thank you for taking time to come for your Medicare Wellness Visit. I appreciate your ongoing commitment to your health goals. Please review the following plan we discussed and let me know if I can assist you in the future.   These are the goals we discussed: Goals    . Increase physical activity          Starting 07/22/16, I will continue to walk at least 30 min daily.        This is a list of the screening recommended for you and due dates:  Health Maintenance  Topic Date Due  . Colon Cancer Screening  04/03/2017*  . DTaP/Tdap/Td vaccine (1 - Tdap) 04/05/2019*  . Flu Shot  11/02/2016  . Tetanus Vaccine  04/05/2019  .  Hepatitis C: One time screening is recommended by Center for Disease Control  (CDC) for  adults born from [birth year] through 1964.   Completed  . Pneumonia vaccines  Completed  *Topic was postponed. The date shown is not the original due date.   Preventive Care for Adults  A healthy lifestyle and preventive care can promote health and wellness. Preventive health guidelines for adults include the following key practices.  . A routine yearly physical is a good way to check with your health care provider about your health and preventive screening. It is a chance to share any concerns and updates on your health and to receive a thorough exam.  . Visit your dentist for a routine exam and preventive care every 6 months. Brush your teeth twice a day and floss once a day. Good oral hygiene prevents tooth decay and gum disease.  . The frequency of eye exams is based on your age, health, family medical history, use  of contact lenses, and other factors. Follow your health care provider's ecommendations for frequency of eye exams.  . Eat a healthy diet. Foods like vegetables, fruits, whole grains, low-fat dairy products, and lean protein foods contain the nutrients you need without too many calories. Decrease your intake of foods high in solid fats, added sugars, and  salt. Eat the right amount of calories for you. Get information about a proper diet from your health care provider, if necessary.  . Regular physical exercise is one of the most important things you can do for your health. Most adults should get at least 150 minutes of moderate-intensity exercise (any activity that increases your heart rate and causes you to sweat) each week. In addition, most adults need muscle-strengthening exercises on 2 or more days a week.  Silver Sneakers may be a benefit available to you. To determine eligibility, you may visit the website: www.silversneakers.com or contact program at (419)831-0305 Mon-Fri between 8AM-8PM.   . Maintain a healthy weight. The body mass index (BMI) is a screening tool to identify possible weight problems. It provides an estimate of body fat based on height and weight. Your health care provider can find your BMI and can help you achieve or maintain a healthy weight.   For adults 20 years and older: ? A BMI below 18.5 is considered underweight. ? A BMI of 18.5 to 24.9 is normal. ? A BMI of 25 to 29.9 is considered overweight. ? A BMI of 30 and above is considered obese.   . Maintain normal blood lipids and cholesterol levels by exercising and minimizing your intake of saturated fat. Eat a balanced diet with plenty of fruit and vegetables. Blood tests for lipids and cholesterol should  begin at age 71 and be repeated every 5 years. If your lipid or cholesterol levels are high, you are over 50, or you are at high risk for heart disease, you may need your cholesterol levels checked more frequently. Ongoing high lipid and cholesterol levels should be treated with medicines if diet and exercise are not working.  . If you smoke, find out from your health care provider how to quit. If you do not use tobacco, please do not start.  . If you choose to drink alcohol, please do not consume more than 2 drinks per day. One drink is considered to be 12 ounces  (355 mL) of beer, 5 ounces (148 mL) of wine, or 1.5 ounces (44 mL) of liquor.  . If you are 36-9 years old, ask your health care provider if you should take aspirin to prevent strokes.  . Use sunscreen. Apply sunscreen liberally and repeatedly throughout the day. You should seek shade when your shadow is shorter than you. Protect yourself by wearing long sleeves, pants, a wide-brimmed hat, and sunglasses year round, whenever you are outdoors.  . Once a month, do a whole body skin exam, using a mirror to look at the skin on your back. Tell your health care provider of new moles, moles that have irregular borders, moles that are larger than a pencil eraser, or moles that have changed in shape or color.

## 2016-07-22 NOTE — Progress Notes (Signed)
Pre visit review using our clinic review tool, if applicable. No additional management support is needed unless otherwise documented below in the visit note. 

## 2016-07-22 NOTE — Progress Notes (Signed)
Subjective:   Jason Cooper is a 71 y.o. male who presents for Medicare Annual/Subsequent preventive examination.  Review of Systems:  N/A Cardiac Risk Factors include: advanced age (>87men, >33 women);obesity (BMI >30kg/m2);male gender;dyslipidemia;hypertension     Objective:    Vitals: BP 120/70 (BP Location: Right Arm, Patient Position: Sitting, Cuff Size: Normal)   Pulse 88   Temp 97.9 F (36.6 C) (Oral)   Ht 6\' 2"  (1.88 m) Comment: no shoes  Wt 258 lb 12 oz (117.4 kg)   SpO2 96%   BMI 33.22 kg/m   Body mass index is 33.22 kg/m.  Tobacco History  Smoking Status  . Former Smoker  . Types: Cigarettes  . Quit date: 06/02/1997  Smokeless Tobacco  . Never Used     Counseling given: No   Past Medical History:  Diagnosis Date  . Acute pulmonary embolism ([place]) 11/28/2015   Incidentally found on CT 11/2015, started lovenox  . Cluster headache    as 71 yo, no more since retired (25 yrs ago)  . Coronary artery disease   . Diabetes mellitus without complication ([place])   . Environmental allergies   . H/O malaria 1969   during [place] (hospitalized in [place])  . H/O varicella   . History of chicken pox   . HLD (hyperlipidemia)   . HTN (hypertension)   . Malignant neoplasm of lower third of esophagus ([place]) 09/15/2015   Followed at [place] by rad onc and onc   . Osteoarthritis    s/p bilat knee replacements  . Prediabetes 2007  . Seasonal allergic rhinitis    Past Surgical History:  Procedure Laterality Date  . ANKLE SURGERY Left 11-1999   haglin deformity [name])  . COLONOSCOPY  2008   WNL, rec rpt 10 yrs [name])  . ESOPHAGOGASTRODUODENOSCOPY (EGD) WITH PROPOFOL N/A 09/09/2015   Procedure: ESOPHAGOGASTRODUODENOSCOPY (EGD) WITH PROPOFOL;  Surgeon: Manya Silvas, MD;  Location: [facility] ENDOSCOPY;  Service: Endoscopy;  Laterality: N/A;  . JOINT REPLACEMENT Right 02/2002  . PORTACATH PLACEMENT  09/2015  . TOTAL KNEE ARTHROPLASTY Bilateral 2003, 2014   right-2003;  left-2014   Family History  Problem Relation Age of Onset  . Hypertension Mother   . Stroke Mother   . Thyroid disease Mother     needed it removed, unsure why  . Stroke Other     maternal and paternal grandparents  . Hypertension Father   . Diabetes Father   . Cancer Paternal Aunt     bone  . Cancer Paternal Aunt     pancreas  . Cancer Cousin     prostate  . Cancer Sister   . CAD Neg Hx    History  Sexual Activity  . Sexual activity: Yes    Outpatient Encounter Prescriptions as of 07/22/2016  Medication Sig  . aspirin EC 81 MG tablet Take 81 mg by mouth daily.  . cetirizine (ZYRTEC) 10 MG tablet Take 10 mg by mouth as needed.   . cholecalciferol (VITAMIN D) 1000 units tablet Take 2,000 Units by mouth daily.   . Cyanocobalamin (B-12 PO) Take 500 mcg by mouth daily.  . Ferrous Sulfate (SLOW RELEASE IRON PO) Take 45 mg by mouth 2 (two) times daily.   . fluorouracil CALGB 85277 in sodium chloride 0.9 % 150 mL Inject 6,000 mg/m2 into the vein over 96 hr.  . LISINOPRIL PO Take 5 mg by mouth daily.  . Multiple Vitamin (MULTIVITAMIN) capsule Take 1 capsule by mouth daily.  . ondansetron (  ZOFRAN) 8 MG tablet Take 8 mg by mouth every 8 (eight) hours as needed for nausea or vomiting.  . pantoprazole (PROTONIX) 40 MG tablet Take 40 mg by mouth 2 (two) times daily before a meal.   . rivaroxaban (XARELTO) 20 MG TABS tablet Take 20 mg by mouth daily.  . simvastatin (ZOCOR) 40 MG tablet Take 40 mg by mouth at bedtime.   . Trastuzumab (HERCEPTIN IV) Inject 450 mg into the vein as directed.  . [DISCONTINUED] lisinopril-hydrochlorothiazide (PRINZIDE,ZESTORETIC) 20-12.5 MG per tablet Take 1 tablet by mouth daily.  . [DISCONTINUED] vitamin B-12 (CYANOCOBALAMIN) 1000 MCG tablet Take 2,000 mcg by mouth daily.   No facility-administered encounter medications on file as of 07/22/2016.     Activities of Daily Living In your present state of health, do you have any difficulty performing the  following activities: 07/22/2016 09/15/2015  Hearing? Y N  Vision? N N  Difficulty concentrating or making decisions? N N  Walking or climbing stairs? N N  Dressing or bathing? N N  Doing errands, shopping? N N  Preparing Food and eating ? N -  Using the Toilet? N -  In the past six months, have you accidently leaked urine? N -  Do you have problems with loss of bowel control? N -  Managing your Medications? N -  Managing your Finances? N -  Housekeeping or managing your Housekeeping? N -  Some recent data might be hidden    Patient Care Team: Ria Bush, MD as PCP - General (Family Medicine)   Assessment:     Hearing Screening   125Hz  250Hz  500Hz  1000Hz  2000Hz  3000Hz  4000Hz  6000Hz  8000Hz   Right ear:   40 40 40  0    Left ear:   40 40 40  40    Vision Screening Comments: Last vision exam on 04/25/16 with [place]   Exercise Activities and Dietary recommendations Current Exercise Habits: Home exercise routine, Type of exercise: walking, Time (Minutes): 30, Frequency (Times/Week): 7, Weekly Exercise (Minutes/Week): 210, Intensity: Mild, Exercise limited by: None identified  Goals    . Increase physical activity          Starting 07/22/16, I will continue to walk at least 30 min daily.       Fall Risk Fall Risk  07/22/2016 07/15/2015 07/11/2014 07/09/2013  Falls in the past year? No No No No   Depression Screen PHQ 2/9 Scores 07/22/2016 07/15/2015 07/11/2014 07/09/2013  PHQ - 2 Score 0 0 0 0    Cognitive Function MMSE - Mini Mental State Exam 07/22/2016  Orientation to time 5  Orientation to Place 5  Registration 3  Attention/ Calculation 0  Recall 3  Language- name 2 objects 0  Language- repeat 1  Language- follow 3 step command 3  Language- read & follow direction 0  Write a sentence 0  Copy design 0  Total score 20     PLEASE NOTE: A Mini-Cog screen was completed. Maximum score is 20. A value of 0 denotes this part of Folstein MMSE was not completed or the patient  failed this part of the Mini-Cog screening.   Mini-Cog Screening Orientation to Time - Max 5 pts Orientation to Place - Max 5 pts Registration - Max 3 pts Recall - Max 3 pts Language Repeat - Max 1 pts Language Follow 3 Step Command - Max 3 pts     Immunization History  Administered Date(s) Administered  . Influenza Whole 01/02/2013  . Influenza, Seasonal, Injecte, Preservative  Fre 12/15/2013  . Influenza-Unspecified 12/19/2014  . Pneumococcal Conjugate-13 07/09/2013  . Pneumococcal Polysaccharide-23 04/04/2009, 05/06/2015  . Td 04/04/2009  . Zoster 04/04/2006   Screening Tests Health Maintenance  Topic Date Due  . COLONOSCOPY  04/03/2017 (Originally 04/04/2016)  . DTaP/Tdap/Td (1 - Tdap) 04/05/2019 (Originally 04/05/2009)  . INFLUENZA VACCINE  11/02/2016  . TETANUS/TDAP  04/05/2019  . Hepatitis C Screening  Completed  . PNA vac Low Risk Adult  Completed      Plan:     I have personally reviewed and addressed the Medicare Annual Wellness questionnaire and have noted the following in the patient's chart:  A. Medical and social history B. Use of alcohol, tobacco or illicit drugs  C. Current medications and supplements D. Functional ability and status E.  Nutritional status F.  Physical activity G. Advance directives H. List of other physicians I.  Hospitalizations, surgeries, and ER visits in previous 12 months J.  [place] to include hearing, vision, cognitive, depression L. Referrals and appointments - none  In addition, I have reviewed and discussed with patient certain preventive protocols, quality metrics, and best practice recommendations. A written personalized care plan for preventive services as well as general preventive health recommendations were provided to patient.  See attached scanned questionnaire for additional information.   Signed,   Lindell Noe, MHA, BS, LPN Health Coach

## 2016-07-24 ENCOUNTER — Encounter: Payer: Self-pay | Admitting: Family Medicine

## 2016-07-24 NOTE — Progress Notes (Signed)
I reviewed health advisor's note, was available for consultation, and agree with documentation and plan.  

## 2016-07-25 DIAGNOSIS — J9811 Atelectasis: Secondary | ICD-10-CM | POA: Diagnosis not present

## 2016-07-25 DIAGNOSIS — Z87891 Personal history of nicotine dependence: Secondary | ICD-10-CM | POA: Diagnosis not present

## 2016-07-25 DIAGNOSIS — C160 Malignant neoplasm of cardia: Secondary | ICD-10-CM | POA: Diagnosis not present

## 2016-07-25 DIAGNOSIS — J90 Pleural effusion, not elsewhere classified: Secondary | ICD-10-CM | POA: Diagnosis not present

## 2016-07-25 DIAGNOSIS — C787 Secondary malignant neoplasm of liver and intrahepatic bile duct: Secondary | ICD-10-CM | POA: Diagnosis not present

## 2016-07-25 DIAGNOSIS — C159 Malignant neoplasm of esophagus, unspecified: Secondary | ICD-10-CM | POA: Diagnosis not present

## 2016-07-25 DIAGNOSIS — Z5112 Encounter for antineoplastic immunotherapy: Secondary | ICD-10-CM | POA: Diagnosis not present

## 2016-07-25 DIAGNOSIS — Z923 Personal history of irradiation: Secondary | ICD-10-CM | POA: Diagnosis not present

## 2016-07-25 DIAGNOSIS — R911 Solitary pulmonary nodule: Secondary | ICD-10-CM | POA: Diagnosis not present

## 2016-07-25 DIAGNOSIS — I2510 Atherosclerotic heart disease of native coronary artery without angina pectoris: Secondary | ICD-10-CM | POA: Diagnosis not present

## 2016-07-25 DIAGNOSIS — C772 Secondary and unspecified malignant neoplasm of intra-abdominal lymph nodes: Secondary | ICD-10-CM | POA: Diagnosis not present

## 2016-07-26 ENCOUNTER — Ambulatory Visit: Payer: Medicare Other

## 2016-07-26 ENCOUNTER — Other Ambulatory Visit: Payer: Medicare Other

## 2016-07-27 ENCOUNTER — Encounter: Payer: Self-pay | Admitting: *Deleted

## 2016-07-29 ENCOUNTER — Ambulatory Visit (INDEPENDENT_AMBULATORY_CARE_PROVIDER_SITE_OTHER): Payer: Medicare Other | Admitting: Family Medicine

## 2016-07-29 ENCOUNTER — Encounter: Payer: Self-pay | Admitting: Family Medicine

## 2016-07-29 VITALS — BP 128/78 | HR 88 | Temp 97.6°F | Wt 263.0 lb

## 2016-07-29 DIAGNOSIS — C159 Malignant neoplasm of esophagus, unspecified: Secondary | ICD-10-CM | POA: Diagnosis not present

## 2016-07-29 DIAGNOSIS — E0590 Thyrotoxicosis, unspecified without thyrotoxic crisis or storm: Secondary | ICD-10-CM | POA: Diagnosis not present

## 2016-07-29 DIAGNOSIS — E119 Type 2 diabetes mellitus without complications: Secondary | ICD-10-CM

## 2016-07-29 DIAGNOSIS — C155 Malignant neoplasm of lower third of esophagus: Secondary | ICD-10-CM | POA: Diagnosis not present

## 2016-07-29 DIAGNOSIS — Z7189 Other specified counseling: Secondary | ICD-10-CM | POA: Diagnosis not present

## 2016-07-29 DIAGNOSIS — I2699 Other pulmonary embolism without acute cor pulmonale: Secondary | ICD-10-CM

## 2016-07-29 DIAGNOSIS — I10 Essential (primary) hypertension: Secondary | ICD-10-CM

## 2016-07-29 DIAGNOSIS — C799 Secondary malignant neoplasm of unspecified site: Secondary | ICD-10-CM

## 2016-07-29 DIAGNOSIS — E785 Hyperlipidemia, unspecified: Secondary | ICD-10-CM

## 2016-07-29 NOTE — Progress Notes (Signed)
Pre visit review using our clinic review tool, if applicable. No additional management support is needed unless otherwise documented below in the visit note. 

## 2016-07-29 NOTE — Patient Instructions (Addendum)
Check with pharmacy about shingrix new 2 shot shingles series.  Sugar was in diabetes range (although controlled).  You are doing well today.  Return in 6 months for follow up visit for diabetes.  Good to see you today, call us with questions.  Health Maintenance, Male A healthy lifestyle and preventive care is important for your health and wellness. Ask your health care provider about what schedule of regular examinations is right for you. What should I know about weight and diet?  Eat a Healthy Diet  Eat plenty of vegetables, fruits, whole grains, low-fat dairy products, and lean protein.  Do not eat a lot of foods high in solid fats, added sugars, or salt. Maintain a Healthy Weight  Regular exercise can help you achieve or maintain a healthy weight. You should:  Do at least 150 minutes of exercise each week. The exercise should increase your heart rate and make you sweat (moderate-intensity exercise).  Do strength-training exercises at least twice a week. Watch Your Levels of Cholesterol and Blood Lipids  Have your blood tested for lipids and cholesterol every 5 years starting at 69 years of age. If you are at high risk for heart disease, you should start having your blood tested when you are 70 years old. You may need to have your cholesterol levels checked more often if:  Your lipid or cholesterol levels are high.  You are older than 70 years of age.  You are at high risk for heart disease. What should I know about cancer screening? Many types of cancers can be detected early and may often be prevented. Lung Cancer  You should be screened every year for lung cancer if:  You are a current smoker who has smoked for at least 30 years.  You are a former smoker who has quit within the past 15 years.  Talk to your health care provider about your screening options, when you should start screening, and how often you should be screened. Colorectal Cancer  Routine colorectal  cancer screening usually begins at 70 years of age and should be repeated every 5-10 years until you are 70 years old. You may need to be screened more often if early forms of precancerous polyps or small growths are found. Your health care provider may recommend screening at an earlier age if you have risk factors for colon cancer.  Your health care provider may recommend using home test kits to check for hidden blood in the stool.  A small camera at the end of a tube can be used to examine your colon (sigmoidoscopy or colonoscopy). This checks for the earliest forms of colorectal cancer. Prostate and Testicular Cancer  Depending on your age and overall health, your health care provider may do certain tests to screen for prostate and testicular cancer.  Talk to your health care provider about any symptoms or concerns you have about testicular or prostate cancer. Skin Cancer  Check your skin from head to toe regularly.  Tell your health care provider about any new moles or changes in moles, especially if:  There is a change in a mole's size, shape, or color.  You have a mole that is larger than a pencil eraser.  Always use sunscreen. Apply sunscreen liberally and repeat throughout the day.  Protect yourself by wearing long sleeves, pants, a wide-brimmed hat, and sunglasses when outside. What should I know about heart disease, diabetes, and high blood pressure?  If you are 16-18 years of age, have your  blood pressure checked every 3-5 years. If you are 30 years of age or older, have your blood pressure checked every year. You should have your blood pressure measured twice-once when you are at a hospital or clinic, and once when you are not at a hospital or clinic. Record the average of the two measurements. To check your blood pressure when you are not at a hospital or clinic, you can use:  An automated blood pressure machine at a pharmacy.  A home blood pressure monitor.  Talk to your  health care provider about your target blood pressure.  If you are between 22-56 years old, ask your health care provider if you should take aspirin to prevent heart disease.  Have regular diabetes screenings by checking your fasting blood sugar level.  If you are at a normal weight and have a low risk for diabetes, have this test once every three years after the age of 1.  If you are overweight and have a high risk for diabetes, consider being tested at a younger age or more often.  A one-time screening for abdominal aortic aneurysm (AAA) by ultrasound is recommended for men aged 22-75 years who are current or former smokers. What should I know about preventing infection? Hepatitis B  If you have a higher risk for hepatitis B, you should be screened for this virus. Talk with your health care provider to find out if you are at risk for hepatitis B infection. Hepatitis C  Blood testing is recommended for:  Everyone born from [birth year] through 1964.  Anyone with known risk factors for hepatitis C. Sexually Transmitted Diseases (STDs)  You should be screened each year for STDs including gonorrhea and chlamydia if:  You are sexually active and are younger than 69 years of age.  You are older than 70 years of age and your health care provider tells you that you are at risk for this type of infection.  Your sexual activity has changed since you were last screened and you are at an increased risk for chlamydia or gonorrhea. Ask your health care provider if you are at risk.  Talk with your health care provider about whether you are at high risk of being infected with HIV. Your health care provider may recommend a prescription medicine to help prevent HIV infection. What else can I do?  Schedule regular health, dental, and eye exams.  Stay current with your vaccines (immunizations).  Do not use any tobacco products, such as cigarettes, chewing tobacco, and e-cigarettes. If you need help  quitting, ask your health care provider.  Limit alcohol intake to no more than 2 drinks per day. One drink equals 12 ounces of beer, 5 ounces of wine, or 1 ounces of hard liquor.  Do not use street drugs.  Do not share needles.  Ask your health care provider for help if you need support or information about quitting drugs.  Tell your health care provider if you often feel depressed.  Tell your health care provider if you have ever been abused or do not feel safe at home. This information is not intended to replace advice given to you by your health care provider. Make sure you discuss any questions you have with your health care provider. Document Released: 09/17/2007 Document Revised: 11/18/2015 Document Reviewed: 12/23/2014 Elsevier Interactive Patient Education  2017 [employer].

## 2016-07-29 NOTE — Assessment & Plan Note (Addendum)
Found incidentally 11/2015. On xarelto 20mg  daily.

## 2016-07-29 NOTE — Assessment & Plan Note (Addendum)
Chronic, stable on zocor. Continue current regimen. LDL new goal <100.

## 2016-07-29 NOTE — Assessment & Plan Note (Signed)
Advanced directives: wife is HCPOA then son. Advanced directives in chart (07/2013).

## 2016-07-29 NOTE — Assessment & Plan Note (Signed)
Chronic stable. Continue current regimen. 

## 2016-07-29 NOTE — Assessment & Plan Note (Signed)
Appreciate [facility] care - continues 2 regimen chemo Q2wks.

## 2016-07-29 NOTE — Assessment & Plan Note (Signed)
Discussed. asxs. Recheck at 6 mo f/u.

## 2016-07-29 NOTE — Assessment & Plan Note (Signed)
New dx. Discussed diabetic diet. Currently well controlled.  RTC 6 mo DM f/u visit

## 2016-07-29 NOTE — Progress Notes (Signed)
BP 128/78   Pulse 88   Temp 97.6 F (36.4 C) (Oral)   Wt 263 lb (119.3 kg)   BMI 33.77 kg/m    CC: AMW f/u visit Subjective:    Patient ID: Jason Cooper, male    DOB: 1946/01/03, 71 y.o.   MRN: [id number]  HPI: Jason Cooper is a 71 y.o. 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 Followed by [facility] oncology Dr Karmen Stabs. Initially received radiation for 5 wks. Currently on folfox-herceptin chemo regimen Q2 wks.   Appetite good. Swallowing well. Normal bowel movements.   Saw Lesia last week for medicare wellness visit. Note reviewed.    Preventative: Colonoscopy 2008 WNL, rpt 10 yrs [name])  Prostate - requests PSA yearly. Some nocturia. Recent CT w/ contrast with enlarged prostate Flu shot - yearly  Tetanus 2011  Pneumovax 2011 and 2017, prevnar 07/2013  Shingles shot - 2008. Discussed shingrix. Pt states onc said ok to take.  Advanced directives: wife is HCPOA then son. Advanced directives in chart (07/2013). Seat belt use discussed Sunscreen use discussed. No changing moles on skin.  Ex smoker quit 1999 Alcohol - none.   Lives with wife, no pets  Occupation: retired Engineer, structural  Edu: graduate degree  Activity: enjoys traveling, walking 2 mi/day  Diet: good water, fruits/vegetables daily, some fish   Relevant past medical, surgical, family and social history reviewed and updated as indicated. Interim medical history since our last visit reviewed. Allergies and medications reviewed and updated. Outpatient Medications Prior to Visit  Medication Sig Dispense Refill  . cetirizine (ZYRTEC) 10 MG tablet Take 10 mg by mouth as needed.     . cholecalciferol (VITAMIN D) 1000 units tablet Take 2,000 Units by mouth daily.     . Cyanocobalamin (B-12 PO) Take 500 mcg by mouth daily.    . Ferrous Sulfate (SLOW RELEASE IRON PO)  Take 45 mg by mouth 2 (two) times daily.     . fluorouracil CALGB 76160 in sodium chloride 0.9 % 150 mL Inject 6,000 mg/m2 into the vein over 96 hr.    . LISINOPRIL PO Take 5 mg by mouth daily.    . Multiple Vitamin (MULTIVITAMIN) capsule Take 1 capsule by mouth daily.    . ondansetron (ZOFRAN) 8 MG tablet Take 8 mg by mouth every 8 (eight) hours as needed for nausea or vomiting.    . pantoprazole (PROTONIX) 40 MG tablet Take 40 mg by mouth 2 (two) times daily before a meal.     . rivaroxaban (XARELTO) 20 MG TABS tablet Take 20 mg by mouth daily.    . simvastatin (ZOCOR) 40 MG tablet Take 40 mg by mouth at bedtime.     . Trastuzumab (HERCEPTIN IV) Inject 450 mg into the vein as directed.    Marland Kitchen aspirin EC 81 MG tablet Take 81 mg by mouth daily.     No facility-administered medications prior to visit.      Per HPI unless specifically indicated in ROS section below Review of Systems     Objective:    BP 128/78   Pulse 88   Temp 97.6 F (36.4 C) (Oral)   Wt 263 lb (119.3 kg)   BMI 33.77 kg/m   Wt Readings from Last 3 Encounters:  07/29/16 263 lb (119.3 kg)  07/22/16 258 lb 12 oz (117.4 kg)  09/15/15 253 lb (114.8 kg)    Physical Exam  Constitutional: He is oriented to person, place, and time. He appears well-developed and well-nourished. No distress.  HENT:  Head: Normocephalic and atraumatic.  Right Ear: Hearing, tympanic membrane, external ear and ear canal normal.  Left Ear: Hearing, tympanic membrane, external ear and ear canal normal.  Nose: Nose normal.  Mouth/Throat: Uvula is midline, oropharynx is clear and moist and mucous membranes are normal. No oropharyngeal exudate, posterior oropharyngeal edema or posterior oropharyngeal erythema.  Eyes: Conjunctivae and EOM are normal. Pupils are equal, round, and reactive to light. No scleral icterus.  Neck: Normal range of motion. Neck supple.  Cardiovascular: Normal rate, regular rhythm, normal heart sounds and intact distal  pulses.   No murmur heard. Pulses:      Radial pulses are 2+ on the right side, and 2+ on the left side.  Pulmonary/Chest: Effort normal and breath sounds normal. No respiratory distress. He has no wheezes. He has no rales.  Abdominal: Soft. Bowel sounds are normal. He exhibits no distension and no mass. There is no tenderness. There is no rebound and no guarding.  Genitourinary: Rectal exam shows external hemorrhoid (noninflamed). Rectal exam shows no internal hemorrhoid, no fissure, no mass, no tenderness and anal tone normal. Prostate is enlarged (50gm). Prostate is not tender.  Musculoskeletal: Normal range of motion. He exhibits no edema.  L 5th digit contraction  Lymphadenopathy:    He has no cervical adenopathy.  Neurological: He is alert and oriented to person, place, and time.  CN grossly intact, station and gait intact  Skin: Skin is warm and dry. No rash noted.  Psychiatric: He has a normal mood and affect. His behavior is normal. Judgment and thought content normal.  Nursing note and vitals reviewed.  Results for orders placed or performed in visit on 07/27/16  CBC  Result Value Ref Range   WBC 3.9    HGB 12.1 g/dL   platelet count 195    HCT 38 %  Comprehensive metabolic panel  Result Value Ref Range   Sodium 139    Potassium 4.1 mmol/L   Creat 1.1    Glucose 136    AST 29 U/L   ALT 28    Total Bilirubin 0.6 mg/dL   Alkaline Phosphatase 72 U/L  HM DIABETES EYE EXAM  Result Value Ref Range   HM Diabetic Eye Exam No Retinopathy No Retinopathy      Assessment & Plan:   Problem List Items Addressed This Visit    Acute pulmonary embolism ([place])    Found incidentally 11/2015. On xarelto 20mg  daily.       Advanced care planning/counseling discussion    Advanced directives: wife is HCPOA then son. Advanced directives in chart (07/2013).      Controlled type 2 diabetes mellitus without complication, without long-term current use of insulin ([place])    New dx.  Discussed diabetic diet. Currently well controlled.  RTC 6 mo DM f/u visit      HLD (hyperlipidemia)    Chronic, stable on zocor. Continue current regimen. LDL new goal <100.      HTN (hypertension)    Chronic stable. Continue current regimen.       Malignant neoplasm of lower third of esophagus (H[facility]) - Primary    Appreciate [facility] care - continues 2 regimen chemo Q2wks.       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 asxs. Recheck at 6  mo f/u.           Follow up plan: Return in about 6 months (around 01/28/2017) for follow up visit.  Ria Bush, MD

## 2016-08-08 DIAGNOSIS — Z5112 Encounter for antineoplastic immunotherapy: Secondary | ICD-10-CM | POA: Diagnosis not present

## 2016-08-08 DIAGNOSIS — C160 Malignant neoplasm of cardia: Secondary | ICD-10-CM | POA: Diagnosis not present

## 2016-08-22 DIAGNOSIS — Z923 Personal history of irradiation: Secondary | ICD-10-CM | POA: Diagnosis not present

## 2016-08-22 DIAGNOSIS — C160 Malignant neoplasm of cardia: Secondary | ICD-10-CM | POA: Diagnosis not present

## 2016-08-22 DIAGNOSIS — G629 Polyneuropathy, unspecified: Secondary | ICD-10-CM | POA: Diagnosis not present

## 2016-08-22 DIAGNOSIS — Z5112 Encounter for antineoplastic immunotherapy: Secondary | ICD-10-CM | POA: Diagnosis not present

## 2016-08-22 DIAGNOSIS — Z87891 Personal history of nicotine dependence: Secondary | ICD-10-CM | POA: Diagnosis not present

## 2016-08-22 DIAGNOSIS — C787 Secondary malignant neoplasm of liver and intrahepatic bile duct: Secondary | ICD-10-CM | POA: Diagnosis not present

## 2016-08-22 DIAGNOSIS — R5383 Other fatigue: Secondary | ICD-10-CM | POA: Diagnosis not present

## 2016-08-26 ENCOUNTER — Ambulatory Visit (INDEPENDENT_AMBULATORY_CARE_PROVIDER_SITE_OTHER): Payer: Medicare Other | Admitting: Primary Care

## 2016-08-26 ENCOUNTER — Encounter: Payer: Self-pay | Admitting: Primary Care

## 2016-08-26 VITALS — BP 140/82 | HR 79 | Temp 97.9°F | Ht 74.0 in | Wt 267.8 lb

## 2016-08-26 DIAGNOSIS — H109 Unspecified conjunctivitis: Secondary | ICD-10-CM

## 2016-08-26 MED ORDER — CIPROFLOXACIN HCL 0.3 % OP SOLN: OPHTHALMIC | 0 refills | Status: DC

## 2016-08-26 NOTE — Patient Instructions (Signed)
Stop Ciprofloxacin eye drops for eye infection.  Administer 1 drop to both eyes, every 2 hours, while awake, for 2 days. Then 1 drop, every 4 hours, while awake, for the next 5 days.   Continue the drops two days after symptoms are gone to ensure complete resolve.   Wash your hands often to prevent spread of infection.  It was a pleasure meeting you!

## 2016-08-26 NOTE — Progress Notes (Signed)
Subjective:    Patient ID: Jason Cooper, male    DOB: [birth year], 71 y.o.   MRN: [id number]  HPI  Jason Cooper is a 71 year old male who presents today with a chief complaint of eye irritation. The irritation is located to the left eye which began around 3-4 days ago. Jason Cooper reports itching, discomfort, watering, and his left eye is matted shut with yellow/green crust. His symptoms have progressed to the right eye as well, not has bad as the left. Jason Cooper denies sore throat, fevers, cough, visual changes. Jason Cooper does not wear contact lenses.  Review of Systems  Constitutional: Negative for fever.  HENT: Negative for congestion, ear pain, rhinorrhea and sore throat.   Eyes: Positive for discharge, redness and itching. Negative for visual disturbance.       Past Medical History:  Diagnosis Date  . Acute pulmonary embolism ([place]) 11/28/2015   Incidentally found on CT 11/2015, started lovenox  . Cluster headache    as 71 yo, no more since retired (25 yrs ago)  . Coronary artery disease   . Diabetes mellitus without complication ([place])   . Environmental allergies   . H/O malaria 1969   during [place] (hospitalized in [place])  . H/O varicella   . History of chicken pox   . HLD (hyperlipidemia)   . HTN (hypertension)   . Malignant neoplasm of lower third of esophagus ([place]) 09/15/2015   Followed at [facility] by rad onc and onc   . Osteoarthritis    s/p bilat knee replacements  . Prediabetes 2007  . Seasonal allergic rhinitis      Social History   Social History  . Marital status: Married    Spouse name: N/A  . Number of children: N/A  . Years of education: N/A   Occupational History  . Not on file.   Social History Main Topics  . Smoking status: Former Smoker    Types: Cigarettes    Quit date: 06/02/1997  . Smokeless tobacco: Never Used  . Alcohol use 0.0 oz/week  . Drug use: No  . Sexual activity: Yes   Other Topics Concern  . Not on file   Social History Narrative   Lives with  wife, no pets   Occupation: retired Engineer, structural   Edu: graduate degree   Activity: enjoys traveling, rides stationary bike every morning 30 min, walking 2 mi/day, goes to [employer]   Diet: good water, fruits/vegetables daily    Past Surgical History:  Procedure Laterality Date  . ANKLE SURGERY Left 11-1999   haglin deformity [name])  . COLONOSCOPY  2008   WNL, rec rpt 10 yrs [name])  . ESOPHAGOGASTRODUODENOSCOPY (EGD) WITH PROPOFOL N/A 09/09/2015   Procedure: ESOPHAGOGASTRODUODENOSCOPY (EGD) WITH PROPOFOL;  Surgeon: Manya Silvas, MD;  Location: [facility] ENDOSCOPY;  Service: Endoscopy;  Laterality: N/A;  . JOINT REPLACEMENT Right 02/2002  . PORTACATH PLACEMENT  09/2015  . TOTAL KNEE ARTHROPLASTY Bilateral 2003, 2014   right-2003; left-2014    Family History  Problem Relation Age of Onset  . Hypertension Mother   . Stroke Mother   . Thyroid disease Mother        needed it removed, unsure why  . Stroke Other        maternal and paternal grandparents  . Hypertension Father   . Diabetes Father   . Cancer Paternal Aunt        bone  . Cancer Paternal Aunt        pancreas  .  Cancer Cousin        prostate  . Cancer Sister   . CAD Neg Hx     No Known Allergies  Current Outpatient Prescriptions on File Prior to Visit  Medication Sig Dispense Refill  . cetirizine (ZYRTEC) 10 MG tablet Take 10 mg by mouth as needed.     . cholecalciferol (VITAMIN D) 1000 units tablet Take 2,000 Units by mouth daily.     . Cyanocobalamin (B-12 PO) Take 500 mcg by mouth daily.    . Ferrous Sulfate (SLOW RELEASE IRON PO) Take 45 mg by mouth 2 (two) times daily.     . fluorouracil CALGB 01751 in sodium chloride 0.9 % 150 mL Inject 6,000 mg/m2 into the vein over 96 hr.    . LISINOPRIL PO Take 5 mg by mouth daily.    . Multiple Vitamin (MULTIVITAMIN) capsule Take 1 capsule by mouth daily.    . ondansetron (ZOFRAN) 8 MG tablet Take 8 mg by mouth every 8 (eight) hours as needed for nausea or vomiting.    .  pantoprazole (PROTONIX) 40 MG tablet Take 40 mg by mouth 2 (two) times daily before a meal.     . rivaroxaban (XARELTO) 20 MG TABS tablet Take 20 mg by mouth daily.    . simvastatin (ZOCOR) 40 MG tablet Take 40 mg by mouth at bedtime.     . Trastuzumab (HERCEPTIN IV) Inject 450 mg into the vein as directed.     No current facility-administered medications on file prior to visit.     BP 140/82   Pulse 79   Temp 97.9 F (36.6 C) (Oral)   Ht 6\' 2"  (1.88 m)   Wt 267 lb 12.8 oz (121.5 kg)   SpO2 97%   BMI 34.38 kg/m    Objective:   Physical Exam  Constitutional: Jason Cooper appears well-nourished.  HENT:  Right Ear: Tympanic membrane and ear canal normal.  Left Ear: Tympanic membrane and ear canal normal.  Nose: No mucosal edema. Right sinus exhibits no maxillary sinus tenderness and no frontal sinus tenderness. Left sinus exhibits no maxillary sinus tenderness and no frontal sinus tenderness.  Mouth/Throat: Oropharynx is clear and moist.  Eyes: Right eye exhibits no discharge. No foreign body present in the right eye. Left eye exhibits discharge. No foreign body present in the left eye. Right conjunctiva is injected. Left conjunctiva is injected.  Neck: Neck supple.  Cardiovascular: Normal rate and regular rhythm.   Pulmonary/Chest: Effort normal and breath sounds normal. Jason Cooper has no wheezes. Jason Cooper has no rales.  Skin: Skin is warm and dry.          Assessment & Plan:  Bacterial Conjunctivitis:  Irritation to bilateral eyes for 3-4 days. Exam today with evidence of bacterial involvement. Rx for Cipro drops sent to pharmacy. Discussed instructions for urse and to continue for 2 additional days after symptoms resolve. Discussed good hand hygiene.  Follow up PRN.  Sheral Flow, NP

## 2016-09-05 DIAGNOSIS — Z5112 Encounter for antineoplastic immunotherapy: Secondary | ICD-10-CM | POA: Diagnosis not present

## 2016-09-05 DIAGNOSIS — C160 Malignant neoplasm of cardia: Secondary | ICD-10-CM | POA: Diagnosis not present

## 2016-09-19 DIAGNOSIS — I517 Cardiomegaly: Secondary | ICD-10-CM | POA: Diagnosis not present

## 2016-09-19 DIAGNOSIS — I2510 Atherosclerotic heart disease of native coronary artery without angina pectoris: Secondary | ICD-10-CM | POA: Diagnosis not present

## 2016-09-19 DIAGNOSIS — Z87891 Personal history of nicotine dependence: Secondary | ICD-10-CM | POA: Diagnosis not present

## 2016-09-19 DIAGNOSIS — R931 Abnormal findings on diagnostic imaging of heart and coronary circulation: Secondary | ICD-10-CM | POA: Diagnosis not present

## 2016-09-19 DIAGNOSIS — Z5112 Encounter for antineoplastic immunotherapy: Secondary | ICD-10-CM | POA: Diagnosis not present

## 2016-09-19 DIAGNOSIS — R918 Other nonspecific abnormal finding of lung field: Secondary | ICD-10-CM | POA: Diagnosis not present

## 2016-09-19 DIAGNOSIS — G629 Polyneuropathy, unspecified: Secondary | ICD-10-CM | POA: Diagnosis not present

## 2016-09-19 DIAGNOSIS — C159 Malignant neoplasm of esophagus, unspecified: Secondary | ICD-10-CM | POA: Diagnosis not present

## 2016-09-19 DIAGNOSIS — R590 Localized enlarged lymph nodes: Secondary | ICD-10-CM | POA: Diagnosis not present

## 2016-09-19 DIAGNOSIS — Z5111 Encounter for antineoplastic chemotherapy: Secondary | ICD-10-CM | POA: Diagnosis not present

## 2016-09-19 DIAGNOSIS — C787 Secondary malignant neoplasm of liver and intrahepatic bile duct: Secondary | ICD-10-CM | POA: Diagnosis not present

## 2016-09-19 DIAGNOSIS — C160 Malignant neoplasm of cardia: Secondary | ICD-10-CM | POA: Diagnosis not present

## 2016-10-10 DIAGNOSIS — Z5111 Encounter for antineoplastic chemotherapy: Secondary | ICD-10-CM | POA: Diagnosis not present

## 2016-10-10 DIAGNOSIS — R5383 Other fatigue: Secondary | ICD-10-CM | POA: Diagnosis not present

## 2016-10-10 DIAGNOSIS — C160 Malignant neoplasm of cardia: Secondary | ICD-10-CM | POA: Diagnosis not present

## 2016-10-10 DIAGNOSIS — Z87891 Personal history of nicotine dependence: Secondary | ICD-10-CM | POA: Diagnosis not present

## 2016-10-10 DIAGNOSIS — C771 Secondary and unspecified malignant neoplasm of intrathoracic lymph nodes: Secondary | ICD-10-CM | POA: Diagnosis not present

## 2016-10-10 DIAGNOSIS — Z5112 Encounter for antineoplastic immunotherapy: Secondary | ICD-10-CM | POA: Diagnosis not present

## 2016-10-10 DIAGNOSIS — C787 Secondary malignant neoplasm of liver and intrahepatic bile duct: Secondary | ICD-10-CM | POA: Diagnosis not present

## 2016-10-10 DIAGNOSIS — Z923 Personal history of irradiation: Secondary | ICD-10-CM | POA: Diagnosis not present

## 2016-10-17 DIAGNOSIS — C159 Malignant neoplasm of esophagus, unspecified: Secondary | ICD-10-CM | POA: Diagnosis not present

## 2016-10-24 DIAGNOSIS — C160 Malignant neoplasm of cardia: Secondary | ICD-10-CM | POA: Diagnosis not present

## 2016-10-24 DIAGNOSIS — G629 Polyneuropathy, unspecified: Secondary | ICD-10-CM | POA: Diagnosis not present

## 2016-10-24 DIAGNOSIS — Z5111 Encounter for antineoplastic chemotherapy: Secondary | ICD-10-CM | POA: Diagnosis not present

## 2016-10-24 DIAGNOSIS — Z923 Personal history of irradiation: Secondary | ICD-10-CM | POA: Diagnosis not present

## 2016-10-24 DIAGNOSIS — Z5112 Encounter for antineoplastic immunotherapy: Secondary | ICD-10-CM | POA: Diagnosis not present

## 2016-10-24 DIAGNOSIS — R5383 Other fatigue: Secondary | ICD-10-CM | POA: Diagnosis not present

## 2016-10-24 DIAGNOSIS — Z87891 Personal history of nicotine dependence: Secondary | ICD-10-CM | POA: Diagnosis not present

## 2016-10-24 DIAGNOSIS — C787 Secondary malignant neoplasm of liver and intrahepatic bile duct: Secondary | ICD-10-CM | POA: Diagnosis not present

## 2016-11-07 DIAGNOSIS — R200 Anesthesia of skin: Secondary | ICD-10-CM | POA: Diagnosis not present

## 2016-11-07 DIAGNOSIS — C787 Secondary malignant neoplasm of liver and intrahepatic bile duct: Secondary | ICD-10-CM | POA: Diagnosis not present

## 2016-11-07 DIAGNOSIS — G629 Polyneuropathy, unspecified: Secondary | ICD-10-CM | POA: Diagnosis not present

## 2016-11-07 DIAGNOSIS — Z87891 Personal history of nicotine dependence: Secondary | ICD-10-CM | POA: Diagnosis not present

## 2016-11-07 DIAGNOSIS — C772 Secondary and unspecified malignant neoplasm of intra-abdominal lymph nodes: Secondary | ICD-10-CM | POA: Diagnosis not present

## 2016-11-07 DIAGNOSIS — C160 Malignant neoplasm of cardia: Secondary | ICD-10-CM | POA: Diagnosis not present

## 2016-11-07 DIAGNOSIS — Z5111 Encounter for antineoplastic chemotherapy: Secondary | ICD-10-CM | POA: Diagnosis not present

## 2016-11-07 DIAGNOSIS — Z5112 Encounter for antineoplastic immunotherapy: Secondary | ICD-10-CM | POA: Diagnosis not present

## 2016-11-21 DIAGNOSIS — C160 Malignant neoplasm of cardia: Secondary | ICD-10-CM | POA: Diagnosis not present

## 2016-11-21 DIAGNOSIS — Z923 Personal history of irradiation: Secondary | ICD-10-CM | POA: Diagnosis not present

## 2016-11-21 DIAGNOSIS — C787 Secondary malignant neoplasm of liver and intrahepatic bile duct: Secondary | ICD-10-CM | POA: Diagnosis not present

## 2016-11-21 DIAGNOSIS — R911 Solitary pulmonary nodule: Secondary | ICD-10-CM | POA: Diagnosis not present

## 2016-11-21 DIAGNOSIS — G629 Polyneuropathy, unspecified: Secondary | ICD-10-CM | POA: Diagnosis not present

## 2016-11-21 DIAGNOSIS — I2510 Atherosclerotic heart disease of native coronary artery without angina pectoris: Secondary | ICD-10-CM | POA: Diagnosis not present

## 2016-11-21 DIAGNOSIS — Z87891 Personal history of nicotine dependence: Secondary | ICD-10-CM | POA: Diagnosis not present

## 2016-11-25 DIAGNOSIS — Z79899 Other long term (current) drug therapy: Secondary | ICD-10-CM | POA: Diagnosis not present

## 2016-11-25 DIAGNOSIS — Z5181 Encounter for therapeutic drug level monitoring: Secondary | ICD-10-CM | POA: Diagnosis not present

## 2016-11-25 DIAGNOSIS — C160 Malignant neoplasm of cardia: Secondary | ICD-10-CM | POA: Diagnosis not present

## 2016-12-02 DIAGNOSIS — C160 Malignant neoplasm of cardia: Secondary | ICD-10-CM | POA: Diagnosis not present

## 2016-12-02 DIAGNOSIS — R160 Hepatomegaly, not elsewhere classified: Secondary | ICD-10-CM | POA: Diagnosis not present

## 2016-12-02 DIAGNOSIS — K769 Liver disease, unspecified: Secondary | ICD-10-CM | POA: Diagnosis not present

## 2016-12-12 DIAGNOSIS — C772 Secondary and unspecified malignant neoplasm of intra-abdominal lymph nodes: Secondary | ICD-10-CM | POA: Diagnosis not present

## 2016-12-12 DIAGNOSIS — C787 Secondary malignant neoplasm of liver and intrahepatic bile duct: Secondary | ICD-10-CM | POA: Diagnosis not present

## 2016-12-12 DIAGNOSIS — Z6834 Body mass index (BMI) 34.0-34.9, adult: Secondary | ICD-10-CM | POA: Diagnosis not present

## 2016-12-12 DIAGNOSIS — Z5112 Encounter for antineoplastic immunotherapy: Secondary | ICD-10-CM | POA: Diagnosis not present

## 2016-12-12 DIAGNOSIS — Z87891 Personal history of nicotine dependence: Secondary | ICD-10-CM | POA: Diagnosis not present

## 2016-12-12 DIAGNOSIS — R635 Abnormal weight gain: Secondary | ICD-10-CM | POA: Diagnosis not present

## 2016-12-12 DIAGNOSIS — Z006 Encounter for examination for normal comparison and control in clinical research program: Secondary | ICD-10-CM | POA: Diagnosis not present

## 2016-12-12 DIAGNOSIS — Z923 Personal history of irradiation: Secondary | ICD-10-CM | POA: Diagnosis not present

## 2016-12-12 DIAGNOSIS — C160 Malignant neoplasm of cardia: Secondary | ICD-10-CM | POA: Diagnosis not present

## 2016-12-12 DIAGNOSIS — R600 Localized edema: Secondary | ICD-10-CM | POA: Diagnosis not present

## 2016-12-12 DIAGNOSIS — R202 Paresthesia of skin: Secondary | ICD-10-CM | POA: Diagnosis not present

## 2016-12-19 DIAGNOSIS — G629 Polyneuropathy, unspecified: Secondary | ICD-10-CM | POA: Diagnosis not present

## 2016-12-19 DIAGNOSIS — Z79899 Other long term (current) drug therapy: Secondary | ICD-10-CM | POA: Diagnosis not present

## 2016-12-19 DIAGNOSIS — C787 Secondary malignant neoplasm of liver and intrahepatic bile duct: Secondary | ICD-10-CM | POA: Diagnosis not present

## 2016-12-19 DIAGNOSIS — C159 Malignant neoplasm of esophagus, unspecified: Secondary | ICD-10-CM | POA: Diagnosis not present

## 2016-12-19 DIAGNOSIS — C160 Malignant neoplasm of cardia: Secondary | ICD-10-CM | POA: Diagnosis not present

## 2016-12-19 DIAGNOSIS — R600 Localized edema: Secondary | ICD-10-CM | POA: Diagnosis not present

## 2016-12-19 DIAGNOSIS — Z87891 Personal history of nicotine dependence: Secondary | ICD-10-CM | POA: Diagnosis not present

## 2016-12-26 DIAGNOSIS — Z006 Encounter for examination for normal comparison and control in clinical research program: Secondary | ICD-10-CM | POA: Diagnosis not present

## 2016-12-26 DIAGNOSIS — Z923 Personal history of irradiation: Secondary | ICD-10-CM | POA: Diagnosis not present

## 2016-12-26 DIAGNOSIS — Z23 Encounter for immunization: Secondary | ICD-10-CM | POA: Diagnosis not present

## 2016-12-26 DIAGNOSIS — Z79899 Other long term (current) drug therapy: Secondary | ICD-10-CM | POA: Diagnosis not present

## 2016-12-26 DIAGNOSIS — C787 Secondary malignant neoplasm of liver and intrahepatic bile duct: Secondary | ICD-10-CM | POA: Diagnosis not present

## 2016-12-26 DIAGNOSIS — C160 Malignant neoplasm of cardia: Secondary | ICD-10-CM | POA: Diagnosis not present

## 2016-12-26 DIAGNOSIS — Z5112 Encounter for antineoplastic immunotherapy: Secondary | ICD-10-CM | POA: Diagnosis not present

## 2016-12-26 DIAGNOSIS — Z87891 Personal history of nicotine dependence: Secondary | ICD-10-CM | POA: Diagnosis not present

## 2016-12-30 IMAGING — RF DG ESOPHAGUS
11 of 17 series · 13 of 24 positions shown · non-contrast
Comparison: None.

CLINICAL DATA: Dysphagia for 3-4 months

EXAM:
ESOPHOGRAM / BARIUM SWALLOW / BARIUM TABLET STUDY
TECHNIQUE: Combined double contrast and single contrast examination performed
using effervescent crystals, thick barium liquid, and thin barium
liquid. The patient was observed with fluoroscopy swallowing a 13 mm
barium sulphate tablet.
FLUOROSCOPY TIME:  Radiation Exposure Index (as provided by the
fluoroscopic device): 18.3 mGy

[Series 1: cp_standard · 0.51mm/px · 2 of 22 frames shown (1 of 11)]
[frame 1/22]
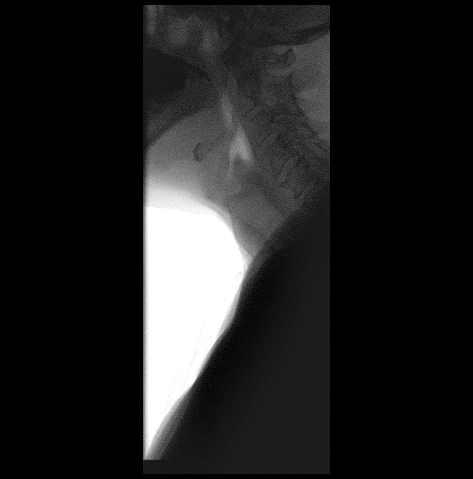
[frame 12/22]
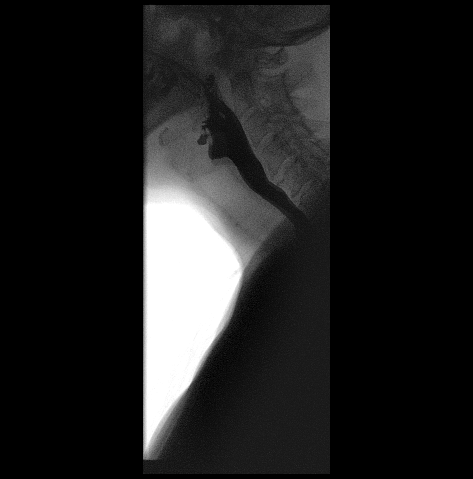

[Series 2: cp_standard · 0.51mm/px · 2 of 9 frames shown (2 of 11)]
[frame 4/9]
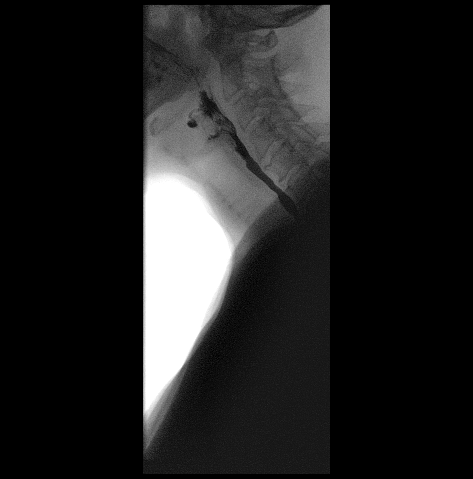
[frame 8/9]
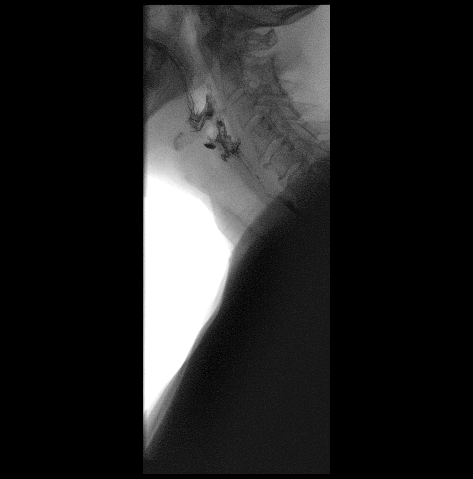

[Series 3: cp_standard · 0.51mm/px · 1 of 35 frames shown (3 of 11)]
[frame 18/35]
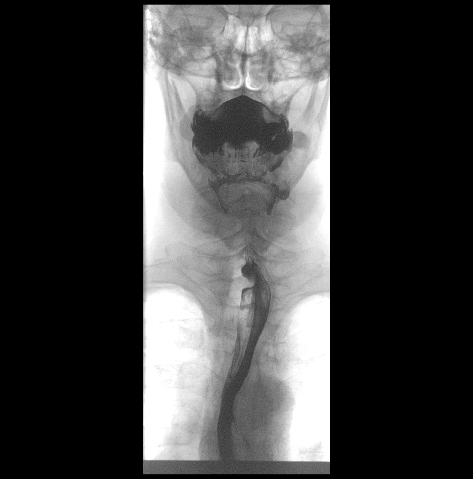

[Series 4: cp_standard · 0.25mm/px · 1 of 1 slices shown (4 of 11)]
[im 1/1]
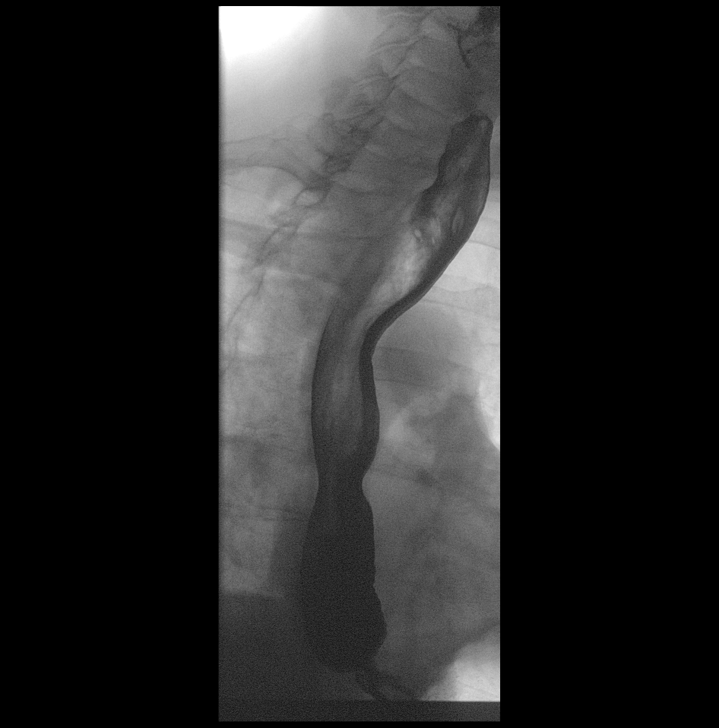

[Series 7: cp_standard · 0.25mm/px · 1 of 1 slices shown (5 of 11)]
[im 1/1]
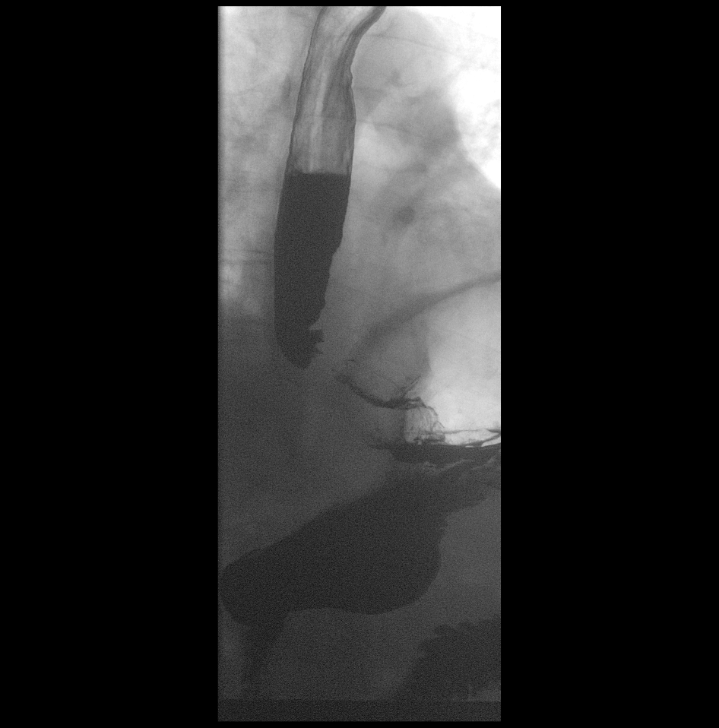

[Series 8: cp_standard · 0.25mm/px · 1 of 1 slices shown (6 of 11)]
[im 1/1]
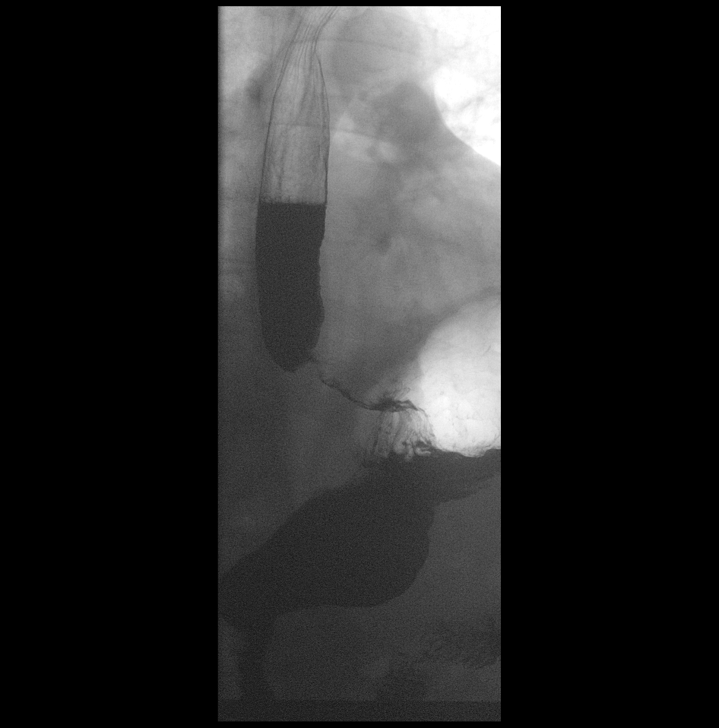

[Series 10: cp_standard · 0.26mm/px · 1 of 1 slices shown (7 of 11)]
[im 1/1]
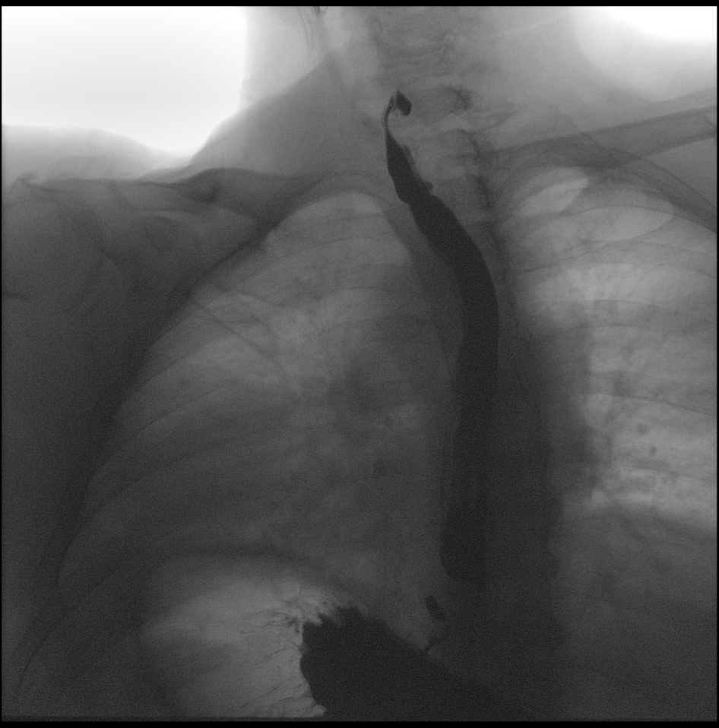

[Series 13: cp_standard · 0.27mm/px · 1 of 1 slices shown (8 of 11)]
[im 1/1]
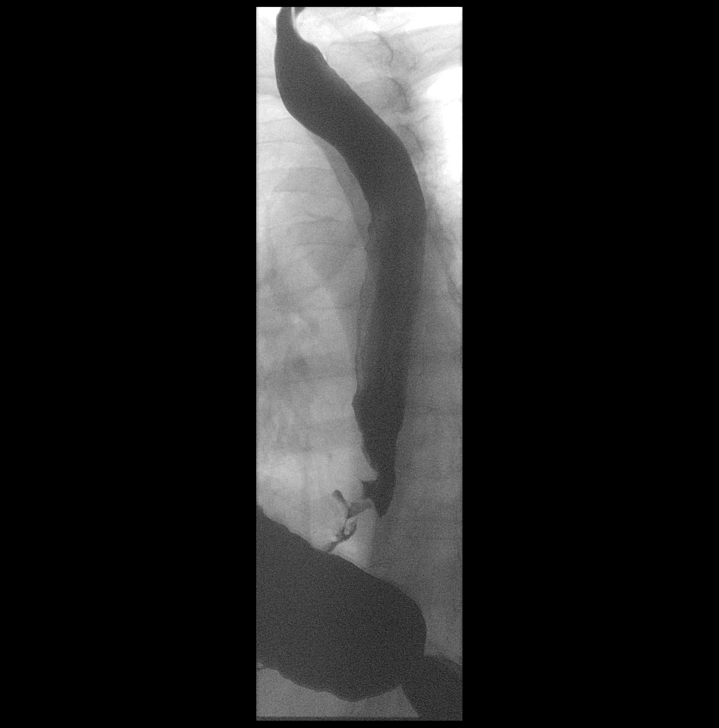

[Series 15: cp_standard · 0.27mm/px · 1 of 1 slices shown (9 of 11)]
[im 1/1]
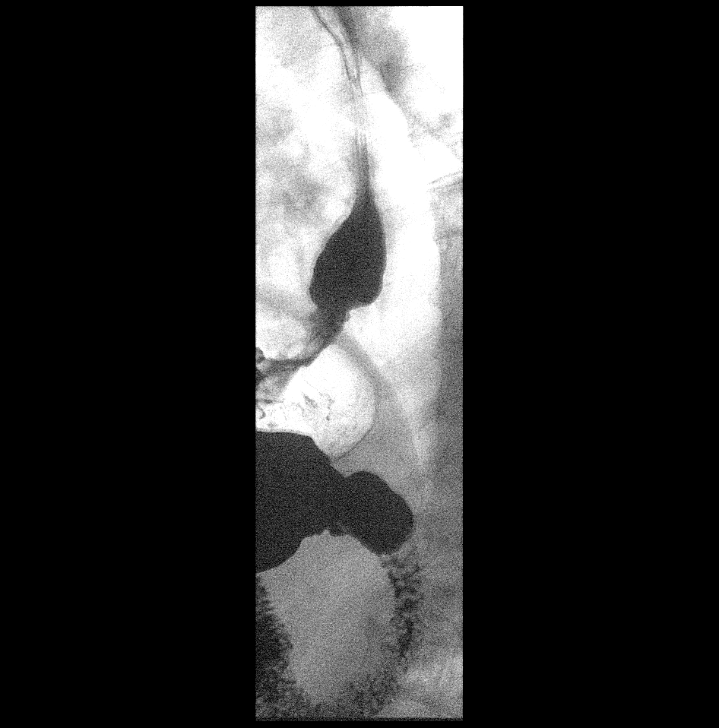

[Series 18: cp_standard · 0.27mm/px · 1 of 1 slices shown (10 of 11)]
[im 1/1]
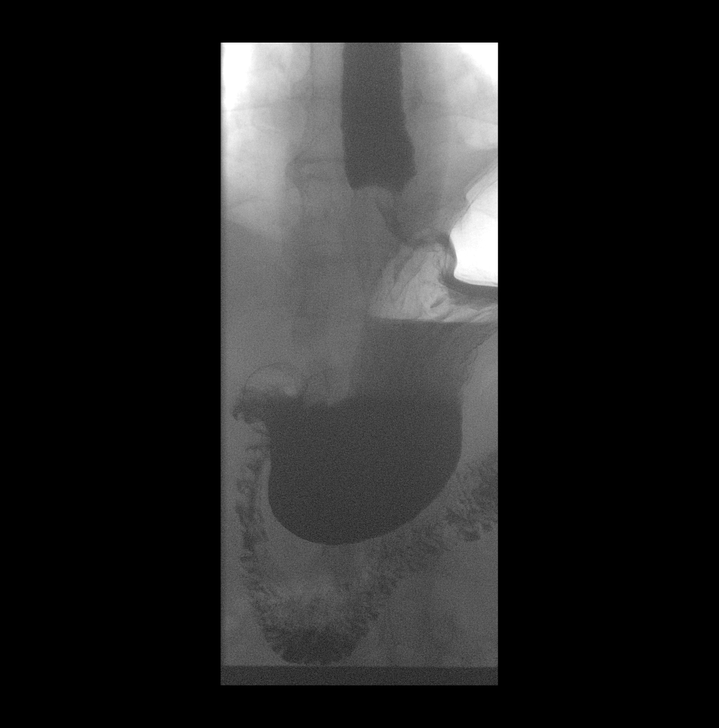

[Series 20: cp_standard · 0.27mm/px · 1 of 1 slices shown (11 of 11)]
[im 1/1]
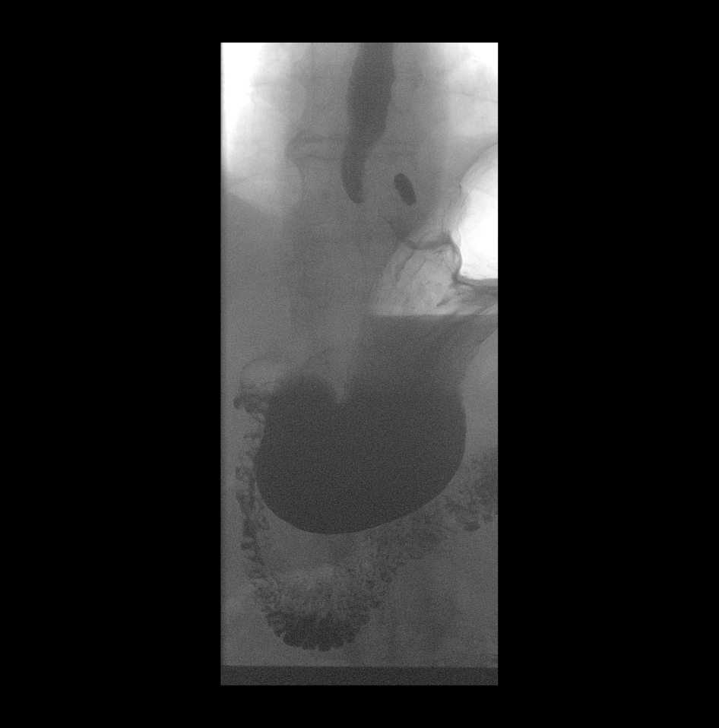

[13 of 24 positions shown; findings below may reference images not displayed]

FINDINGS: There was normal pharyngeal anatomy and motility. Contrast flowed
freely through the esophagus without evidence of a mass. There was
normal esophageal mucosa without evidence of irregularity or
ulceration. Mild distal esophageal irregularity and narrowing
restricting the passage of a 13 mm barium tablet. Esophageal
motility was normal. Severe gastroesophageal reflux. No definite
hiatal hernia was demonstrated.

At the end of the examination a 13 mm barium tablet was administered
which transited through the esophagus and esophagogastric junction
without delay.
IMPRESSION: Mild distal esophageal irregularity and narrowing restricting the
passage of a 13 mm barium tablet most concerning for a stricture
which may be postinflammatory given the presence of severe
gastroesophageal reflux. Further evaluation with upper endoscopy is
recommended.

## 2017-01-02 DIAGNOSIS — Z5112 Encounter for antineoplastic immunotherapy: Secondary | ICD-10-CM | POA: Diagnosis not present

## 2017-01-02 DIAGNOSIS — Z79899 Other long term (current) drug therapy: Secondary | ICD-10-CM | POA: Diagnosis not present

## 2017-01-02 DIAGNOSIS — Z87891 Personal history of nicotine dependence: Secondary | ICD-10-CM | POA: Diagnosis not present

## 2017-01-02 DIAGNOSIS — C772 Secondary and unspecified malignant neoplasm of intra-abdominal lymph nodes: Secondary | ICD-10-CM | POA: Diagnosis not present

## 2017-01-02 DIAGNOSIS — Z006 Encounter for examination for normal comparison and control in clinical research program: Secondary | ICD-10-CM | POA: Diagnosis not present

## 2017-01-02 DIAGNOSIS — R141 Gas pain: Secondary | ICD-10-CM | POA: Diagnosis not present

## 2017-01-02 DIAGNOSIS — C160 Malignant neoplasm of cardia: Secondary | ICD-10-CM | POA: Diagnosis not present

## 2017-01-02 DIAGNOSIS — Z923 Personal history of irradiation: Secondary | ICD-10-CM | POA: Diagnosis not present

## 2017-01-02 DIAGNOSIS — R600 Localized edema: Secondary | ICD-10-CM | POA: Diagnosis not present

## 2017-01-02 DIAGNOSIS — C787 Secondary malignant neoplasm of liver and intrahepatic bile duct: Secondary | ICD-10-CM | POA: Diagnosis not present

## 2017-01-09 DIAGNOSIS — R160 Hepatomegaly, not elsewhere classified: Secondary | ICD-10-CM | POA: Diagnosis not present

## 2017-01-16 DIAGNOSIS — E785 Hyperlipidemia, unspecified: Secondary | ICD-10-CM | POA: Diagnosis not present

## 2017-01-16 DIAGNOSIS — G629 Polyneuropathy, unspecified: Secondary | ICD-10-CM | POA: Diagnosis not present

## 2017-01-16 DIAGNOSIS — C787 Secondary malignant neoplasm of liver and intrahepatic bile duct: Secondary | ICD-10-CM | POA: Diagnosis not present

## 2017-01-16 DIAGNOSIS — Z87891 Personal history of nicotine dependence: Secondary | ICD-10-CM | POA: Diagnosis not present

## 2017-01-16 DIAGNOSIS — C160 Malignant neoplasm of cardia: Secondary | ICD-10-CM | POA: Diagnosis not present

## 2017-01-16 DIAGNOSIS — Z006 Encounter for examination for normal comparison and control in clinical research program: Secondary | ICD-10-CM | POA: Diagnosis not present

## 2017-01-16 DIAGNOSIS — Z5112 Encounter for antineoplastic immunotherapy: Secondary | ICD-10-CM | POA: Diagnosis not present

## 2017-01-22 ENCOUNTER — Other Ambulatory Visit: Payer: Self-pay | Admitting: Family Medicine

## 2017-01-22 DIAGNOSIS — E119 Type 2 diabetes mellitus without complications: Secondary | ICD-10-CM

## 2017-01-22 DIAGNOSIS — E785 Hyperlipidemia, unspecified: Secondary | ICD-10-CM

## 2017-01-22 DIAGNOSIS — R972 Elevated prostate specific antigen [PSA]: Secondary | ICD-10-CM | POA: Insufficient documentation

## 2017-01-22 DIAGNOSIS — E0590 Thyrotoxicosis, unspecified without thyrotoxic crisis or storm: Secondary | ICD-10-CM

## 2017-01-23 DIAGNOSIS — C159 Malignant neoplasm of esophagus, unspecified: Secondary | ICD-10-CM | POA: Diagnosis not present

## 2017-01-23 DIAGNOSIS — Z79899 Other long term (current) drug therapy: Secondary | ICD-10-CM | POA: Diagnosis not present

## 2017-01-23 DIAGNOSIS — Z5112 Encounter for antineoplastic immunotherapy: Secondary | ICD-10-CM | POA: Diagnosis not present

## 2017-01-23 DIAGNOSIS — C787 Secondary malignant neoplasm of liver and intrahepatic bile duct: Secondary | ICD-10-CM | POA: Diagnosis not present

## 2017-01-23 DIAGNOSIS — C772 Secondary and unspecified malignant neoplasm of intra-abdominal lymph nodes: Secondary | ICD-10-CM | POA: Diagnosis not present

## 2017-01-23 DIAGNOSIS — K429 Umbilical hernia without obstruction or gangrene: Secondary | ICD-10-CM | POA: Diagnosis not present

## 2017-01-23 DIAGNOSIS — C160 Malignant neoplasm of cardia: Secondary | ICD-10-CM | POA: Diagnosis not present

## 2017-01-23 DIAGNOSIS — Z006 Encounter for examination for normal comparison and control in clinical research program: Secondary | ICD-10-CM | POA: Diagnosis not present

## 2017-01-23 DIAGNOSIS — K5730 Diverticulosis of large intestine without perforation or abscess without bleeding: Secondary | ICD-10-CM | POA: Diagnosis not present

## 2017-01-23 DIAGNOSIS — I2510 Atherosclerotic heart disease of native coronary artery without angina pectoris: Secondary | ICD-10-CM | POA: Diagnosis not present

## 2017-01-23 DIAGNOSIS — R911 Solitary pulmonary nodule: Secondary | ICD-10-CM | POA: Diagnosis not present

## 2017-01-24 ENCOUNTER — Other Ambulatory Visit (INDEPENDENT_AMBULATORY_CARE_PROVIDER_SITE_OTHER): Payer: Medicare Other

## 2017-01-24 DIAGNOSIS — E785 Hyperlipidemia, unspecified: Secondary | ICD-10-CM | POA: Diagnosis not present

## 2017-01-24 DIAGNOSIS — E0590 Thyrotoxicosis, unspecified without thyrotoxic crisis or storm: Secondary | ICD-10-CM

## 2017-01-24 DIAGNOSIS — E119 Type 2 diabetes mellitus without complications: Secondary | ICD-10-CM

## 2017-01-24 DIAGNOSIS — R972 Elevated prostate specific antigen [PSA]: Secondary | ICD-10-CM | POA: Diagnosis not present

## 2017-01-24 LAB — TSH: TSH: 0.36 u[IU]/mL (ref 0.35–4.50)

## 2017-01-24 LAB — HEMOGLOBIN A1C: HEMOGLOBIN A1C: 6.8 % — AB (ref 4.6–6.5)

## 2017-01-24 LAB — T4, FREE: Free T4: 0.94 ng/dL (ref 0.60–1.60)

## 2017-01-24 LAB — LDL CHOLESTEROL, DIRECT: LDL DIRECT: 108 mg/dL

## 2017-01-25 LAB — PSA, TOTAL WITH REFLEX TO PSA, FREE: PSA, TOTAL: 5.2 ng/mL — AB (ref <=4.0)

## 2017-01-25 LAB — REFLEX PSA, FREE
PSA, % FREE: 21 % — AB (ref >25)
PSA, Free: 1.1 ng/mL

## 2017-01-27 ENCOUNTER — Ambulatory Visit (INDEPENDENT_AMBULATORY_CARE_PROVIDER_SITE_OTHER): Payer: Medicare Other | Admitting: Family Medicine

## 2017-01-27 ENCOUNTER — Encounter: Payer: Self-pay | Admitting: Family Medicine

## 2017-01-27 VITALS — BP 118/70 | HR 84 | Temp 97.7°F | Wt 272.5 lb

## 2017-01-27 DIAGNOSIS — C159 Malignant neoplasm of esophagus, unspecified: Secondary | ICD-10-CM | POA: Diagnosis not present

## 2017-01-27 DIAGNOSIS — I10 Essential (primary) hypertension: Secondary | ICD-10-CM | POA: Diagnosis not present

## 2017-01-27 DIAGNOSIS — C155 Malignant neoplasm of lower third of esophagus: Secondary | ICD-10-CM

## 2017-01-27 DIAGNOSIS — E785 Hyperlipidemia, unspecified: Secondary | ICD-10-CM | POA: Diagnosis not present

## 2017-01-27 DIAGNOSIS — R972 Elevated prostate specific antigen [PSA]: Secondary | ICD-10-CM | POA: Diagnosis not present

## 2017-01-27 DIAGNOSIS — E0590 Thyrotoxicosis, unspecified without thyrotoxic crisis or storm: Secondary | ICD-10-CM | POA: Diagnosis not present

## 2017-01-27 DIAGNOSIS — C799 Secondary malignant neoplasm of unspecified site: Secondary | ICD-10-CM | POA: Diagnosis not present

## 2017-01-27 DIAGNOSIS — E119 Type 2 diabetes mellitus without complications: Secondary | ICD-10-CM

## 2017-01-27 NOTE — Patient Instructions (Addendum)
Prostate level elevated - likely benign enlargement of the prostate - we will continue to watch this. Sugars stable - diet controlled diabetes - continue watching diet.  Return in 6 months for medicare wellness visit and follow up.

## 2017-01-27 NOTE — Assessment & Plan Note (Signed)
Chronic, stable continue current regimen.  

## 2017-01-27 NOTE — Assessment & Plan Note (Signed)
Discussed trending increase - denies BPH sxs. Recent CT with prostatomegaly. Anticipate BPH related elevated PSA. rec Q6 mo monitoring.

## 2017-01-27 NOTE — Progress Notes (Signed)
BP 118/70 (BP Location: Left Arm, Patient Position: Sitting, Cuff Size: Large)   Pulse 84   Temp 97.7 F (36.5 C) (Oral)   Wt 272 lb 8 oz (123.6 kg)   SpO2 96%   BMI 34.99 kg/m    CC: 6 mo /fu visit Subjective:    Patient ID: Jason Cooper, male    DOB: 1946/02/18, 71 y.o.   MRN: [id number]  HPI: Jason Cooper is a 71 y.o. male presenting on 01/27/2017 for 6 mo follow-up   Saw Katha Cabal 07/2016 for medicare wellness visit. Note reviewed.    Known esophageal carcinoma followed at [facility]) on investigational study with immunotherapy - has completed 2 cycles, latest CT this week showed increased size of hepatic mets and retroperitoneal LN.   He has been feeling overall well. No fevers, chest pain or cough.  Denies hyperthyroid symptoms.  H/o PE 2017, on xarelto since then  Diabetic Foot Exam - Simple   Simple Foot Form Diabetic Foot exam was performed with the following findings:  Yes 01/27/2017 10:14 AM  Visual Inspection No deformities, no ulcerations, no other skin breakdown bilaterally:  Yes Sensation Testing Intact to touch and monofilament testing bilaterally:  Yes Pulse Check See comments:  Yes Comments Diminished pulses bilaterally      Relevant past medical, surgical, family and social history reviewed and updated as indicated. Interim medical history since our last visit reviewed. Allergies and medications reviewed and updated. Outpatient Medications Prior to Visit  Medication Sig Dispense Refill  . Cyanocobalamin (B-12 PO) Take 500 mcg by mouth daily.    . Ferrous Sulfate (SLOW RELEASE IRON PO) Take 45 mg by mouth daily.     . Multiple Vitamin (MULTIVITAMIN) capsule Take 1 capsule by mouth daily.    . ondansetron (ZOFRAN) 8 MG tablet Take 8 mg by mouth every 8 (eight) hours as needed for nausea or vomiting.    . pantoprazole (PROTONIX) 40 MG tablet Take 40 mg by mouth 2 (two) times daily before a meal.     . rivaroxaban (XARELTO) 20 MG TABS tablet  Take 20 mg by mouth daily.    . simvastatin (ZOCOR) 40 MG tablet Take 40 mg by mouth at bedtime.     . cetirizine (ZYRTEC) 10 MG tablet Take 10 mg by mouth as needed.     . cholecalciferol (VITAMIN D) 1000 units tablet Take 2,000 Units by mouth daily.     . ciprofloxacin (CILOXAN) 0.3 % ophthalmic solution Administer 1 drop to both eyes, every 2 hours, while awake, for 2 days. Then 1 drop, every 4 hours, while awake, for the next 5 days. 5 mL 0  . fluorouracil CALGB 29528 in sodium chloride 0.9 % 150 mL Inject 6,000 mg/m2 into the vein over 96 hr.    . LISINOPRIL PO Take 5 mg by mouth daily.    . Trastuzumab (HERCEPTIN IV) Inject 450 mg into the vein as directed.     No facility-administered medications prior to visit.      Per HPI unless specifically indicated in ROS section below Review of Systems     Objective:    BP 118/70 (BP Location: Left Arm, Patient Position: Sitting, Cuff Size: Large)   Pulse 84   Temp 97.7 F (36.5 C) (Oral)   Wt 272 lb 8 oz (123.6 kg)   SpO2 96%   BMI 34.99 kg/m   Wt Readings from Last 3 Encounters:  01/27/17 272 lb 8 oz (123.6 kg)  08/26/16  267 lb 12.8 oz (121.5 kg)  07/29/16 263 lb (119.3 kg)    Physical Exam  Constitutional: He appears well-developed and well-nourished. No distress.  HENT:  Head: Normocephalic and atraumatic.  Right Ear: External ear normal.  Left Ear: External ear normal.  Nose: Nose normal.  Mouth/Throat: Oropharynx is clear and moist. No oropharyngeal exudate.  Eyes: Pupils are equal, round, and reactive to light. Conjunctivae and EOM are normal. No scleral icterus.  Neck: Normal range of motion. Neck supple.  Cardiovascular: Normal rate, regular rhythm, normal heart sounds and intact distal pulses.   No murmur heard. Pulmonary/Chest: Effort normal and breath sounds normal. No respiratory distress. He has no wheezes. He has no rales.  Musculoskeletal: He exhibits no edema.  See HPI for foot exam if done    Lymphadenopathy:    He has no cervical adenopathy.  Skin: Skin is warm and dry. No rash noted.  Psychiatric: He has a normal mood and affect.  Nursing note and vitals reviewed.  Results for orders placed or performed in visit on 01/24/17  TSH  Result Value Ref Range   TSH 0.36 0.35 - 4.50 uIU/mL  Hemoglobin A1c  Result Value Ref Range   Hgb A1c MFr Bld 6.8 (H) 4.6 - 6.5 %  LDL Cholesterol, Direct  Result Value Ref Range   Direct LDL 108.0 mg/dL  T4, free  Result Value Ref Range   Free T4 0.94 0.60 - 1.60 ng/dL  PSA, Total with Reflex to PSA, Free  Result Value Ref Range   PSA, Total 5.2 (H) < OR = 4.0 ng/mL  reflex PSA, Free  Result Value Ref Range   PSA, Free 1.1 ng/mL   PSA, % Free 21 (L) >25 % (calc)   Lab Results  Component Value Date   PSA 5.55 (H) 07/22/2016   PSA 3.96 07/10/2015   PSA 3.16 07/04/2014       Assessment & Plan:   Problem List Items Addressed This Visit    Controlled type 2 diabetes mellitus without complication, without long-term current use of insulin (HCC) - Primary    Chronic, diet controlled. Foot exam today. Continue off meds for now.  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 Recent CT with prostatomegaly. Anticipate BPH related elevated PSA. rec Q6 mo monitoring.       HLD (hyperlipidemia)    Mildly elevated LDL despite zocor - continue to monitor. Discussed possible future transition to stronger statin.       Relevant Medications   lisinopril (PRINIVIL,ZESTRIL) 5 MG tablet   HTN (hypertension)    Chronic, stable continue current regimen.       Relevant Medications   lisinopril (PRINIVIL,ZESTRIL) 5 MG tablet   Malignant neoplasm of lower third of esophagus ([place])    Appreciate [facility] care. 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       Relevant Medications   sodium chloride 0.9 % SOLN 50 mL with pembrolizumab 100 MG/4ML SOLN 2 mg/kg   Subclinical hyperthyroidism    Chronic, asxs. Continue to monitor.           Follow up plan: Return in about 6 months (around 07/28/2017)  for medicare wellness visit.  Ria Bush, MD

## 2017-01-27 NOTE — Assessment & Plan Note (Addendum)
Chronic, diet controlled. Foot exam today. Continue off meds for now.  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

## 2017-01-27 NOTE — Assessment & Plan Note (Signed)
Chronic, asxs. Continue to monitor.

## 2017-01-27 NOTE — Assessment & Plan Note (Addendum)
Appreciate [facility] care. On immunotherapy

## 2017-01-27 NOTE — Assessment & Plan Note (Signed)
Mildly elevated LDL despite zocor - continue to monitor. Discussed possible future transition to stronger statin.

## 2017-01-27 NOTE — Assessment & Plan Note (Signed)
Followed at [facility] 

## 2017-02-06 DIAGNOSIS — C787 Secondary malignant neoplasm of liver and intrahepatic bile duct: Secondary | ICD-10-CM | POA: Diagnosis not present

## 2017-02-06 DIAGNOSIS — Z5111 Encounter for antineoplastic chemotherapy: Secondary | ICD-10-CM | POA: Diagnosis not present

## 2017-02-06 DIAGNOSIS — Z79899 Other long term (current) drug therapy: Secondary | ICD-10-CM | POA: Diagnosis not present

## 2017-02-06 DIAGNOSIS — Z87891 Personal history of nicotine dependence: Secondary | ICD-10-CM | POA: Diagnosis not present

## 2017-02-06 DIAGNOSIS — C772 Secondary and unspecified malignant neoplasm of intra-abdominal lymph nodes: Secondary | ICD-10-CM | POA: Diagnosis not present

## 2017-02-06 DIAGNOSIS — C160 Malignant neoplasm of cardia: Secondary | ICD-10-CM | POA: Diagnosis not present

## 2017-02-13 DIAGNOSIS — R5383 Other fatigue: Secondary | ICD-10-CM | POA: Diagnosis not present

## 2017-02-13 DIAGNOSIS — C160 Malignant neoplasm of cardia: Secondary | ICD-10-CM | POA: Diagnosis not present

## 2017-02-13 DIAGNOSIS — Z87891 Personal history of nicotine dependence: Secondary | ICD-10-CM | POA: Diagnosis not present

## 2017-02-13 DIAGNOSIS — Z5111 Encounter for antineoplastic chemotherapy: Secondary | ICD-10-CM | POA: Diagnosis not present

## 2017-02-13 DIAGNOSIS — Z5112 Encounter for antineoplastic immunotherapy: Secondary | ICD-10-CM | POA: Diagnosis not present

## 2017-02-13 DIAGNOSIS — G629 Polyneuropathy, unspecified: Secondary | ICD-10-CM | POA: Diagnosis not present

## 2017-02-13 DIAGNOSIS — C787 Secondary malignant neoplasm of liver and intrahepatic bile duct: Secondary | ICD-10-CM | POA: Diagnosis not present

## 2017-02-27 DIAGNOSIS — E785 Hyperlipidemia, unspecified: Secondary | ICD-10-CM | POA: Diagnosis not present

## 2017-02-27 DIAGNOSIS — C160 Malignant neoplasm of cardia: Secondary | ICD-10-CM | POA: Diagnosis not present

## 2017-02-27 DIAGNOSIS — Z006 Encounter for examination for normal comparison and control in clinical research program: Secondary | ICD-10-CM | POA: Diagnosis not present

## 2017-03-06 DIAGNOSIS — Z5111 Encounter for antineoplastic chemotherapy: Secondary | ICD-10-CM | POA: Diagnosis not present

## 2017-03-06 DIAGNOSIS — C772 Secondary and unspecified malignant neoplasm of intra-abdominal lymph nodes: Secondary | ICD-10-CM | POA: Diagnosis not present

## 2017-03-06 DIAGNOSIS — R600 Localized edema: Secondary | ICD-10-CM | POA: Diagnosis not present

## 2017-03-06 DIAGNOSIS — Z923 Personal history of irradiation: Secondary | ICD-10-CM | POA: Diagnosis not present

## 2017-03-06 DIAGNOSIS — Z87891 Personal history of nicotine dependence: Secondary | ICD-10-CM | POA: Diagnosis not present

## 2017-03-06 DIAGNOSIS — C160 Malignant neoplasm of cardia: Secondary | ICD-10-CM | POA: Diagnosis not present

## 2017-03-06 DIAGNOSIS — C787 Secondary malignant neoplasm of liver and intrahepatic bile duct: Secondary | ICD-10-CM | POA: Diagnosis not present

## 2017-03-06 DIAGNOSIS — Z5112 Encounter for antineoplastic immunotherapy: Secondary | ICD-10-CM | POA: Diagnosis not present

## 2017-03-06 DIAGNOSIS — G629 Polyneuropathy, unspecified: Secondary | ICD-10-CM | POA: Diagnosis not present

## 2017-03-20 DIAGNOSIS — M7989 Other specified soft tissue disorders: Secondary | ICD-10-CM | POA: Diagnosis not present

## 2017-03-20 DIAGNOSIS — C771 Secondary and unspecified malignant neoplasm of intrathoracic lymph nodes: Secondary | ICD-10-CM | POA: Diagnosis not present

## 2017-03-20 DIAGNOSIS — Z87891 Personal history of nicotine dependence: Secondary | ICD-10-CM | POA: Diagnosis not present

## 2017-03-20 DIAGNOSIS — C160 Malignant neoplasm of cardia: Secondary | ICD-10-CM | POA: Diagnosis not present

## 2017-03-20 DIAGNOSIS — Z5111 Encounter for antineoplastic chemotherapy: Secondary | ICD-10-CM | POA: Diagnosis not present

## 2017-03-20 DIAGNOSIS — R5383 Other fatigue: Secondary | ICD-10-CM | POA: Diagnosis not present

## 2017-03-20 DIAGNOSIS — R600 Localized edema: Secondary | ICD-10-CM | POA: Diagnosis not present

## 2017-03-20 DIAGNOSIS — C787 Secondary malignant neoplasm of liver and intrahepatic bile duct: Secondary | ICD-10-CM | POA: Diagnosis not present

## 2017-03-20 DIAGNOSIS — J040 Acute laryngitis: Secondary | ICD-10-CM | POA: Diagnosis not present

## 2017-03-20 DIAGNOSIS — Z5112 Encounter for antineoplastic immunotherapy: Secondary | ICD-10-CM | POA: Diagnosis not present

## 2017-04-03 DIAGNOSIS — Z5112 Encounter for antineoplastic immunotherapy: Secondary | ICD-10-CM | POA: Diagnosis not present

## 2017-04-03 DIAGNOSIS — C160 Malignant neoplasm of cardia: Secondary | ICD-10-CM | POA: Diagnosis not present

## 2017-04-03 DIAGNOSIS — I340 Nonrheumatic mitral (valve) insufficiency: Secondary | ICD-10-CM | POA: Diagnosis not present

## 2017-04-03 DIAGNOSIS — I517 Cardiomegaly: Secondary | ICD-10-CM | POA: Diagnosis not present

## 2017-04-03 DIAGNOSIS — Z5111 Encounter for antineoplastic chemotherapy: Secondary | ICD-10-CM | POA: Diagnosis not present

## 2017-04-03 DIAGNOSIS — R931 Abnormal findings on diagnostic imaging of heart and coronary circulation: Secondary | ICD-10-CM | POA: Diagnosis not present

## 2017-04-21 ENCOUNTER — Ambulatory Visit (INDEPENDENT_AMBULATORY_CARE_PROVIDER_SITE_OTHER): Payer: Medicare Other | Admitting: Family Medicine

## 2017-04-21 ENCOUNTER — Encounter: Payer: Self-pay | Admitting: Family Medicine

## 2017-04-21 VITALS — BP 132/80 | HR 94 | Temp 98.0°F | Wt 261.0 lb

## 2017-04-21 DIAGNOSIS — C155 Malignant neoplasm of lower third of esophagus: Secondary | ICD-10-CM | POA: Diagnosis not present

## 2017-04-21 DIAGNOSIS — R05 Cough: Secondary | ICD-10-CM | POA: Diagnosis not present

## 2017-04-21 DIAGNOSIS — J019 Acute sinusitis, unspecified: Secondary | ICD-10-CM | POA: Insufficient documentation

## 2017-04-21 DIAGNOSIS — R059 Cough, unspecified: Secondary | ICD-10-CM

## 2017-04-21 MED ORDER — GUAIFENESIN-CODEINE 100-10 MG/5ML PO SYRP: 5.0000 mL | ORAL_SOLUTION | Freq: Two times a day (BID) | ORAL | 0 refills | Status: DC | PRN

## 2017-04-21 MED ORDER — AMOXICILLIN-POT CLAVULANATE 875-125 MG PO TABS: 1.0000 | ORAL_TABLET | Freq: Two times a day (BID) | ORAL | 0 refills | Status: AC

## 2017-04-21 MED ORDER — LOSARTAN POTASSIUM 25 MG PO TABS: 25.0000 mg | ORAL_TABLET | Freq: Every day | ORAL | 7 refills | Status: DC

## 2017-04-21 NOTE — Patient Instructions (Addendum)
I think you may have sinus infection - treat with augmentin twice daily for 10 days.  Push fluids and rest.  Let us know if not improving with treatment. Codeine cough syrup for night time.  Lisinopril may be contributing to cough- change to losartan 25mg .

## 2017-04-21 NOTE — Assessment & Plan Note (Addendum)
Reviewed recent CT scan at [place] sinusitis related. Possible ACEI contribution - will switch lisinopril to losartan 25mg . Reviewed recent recall list, Losartan I've prescribed should not be affected.

## 2017-04-21 NOTE — Assessment & Plan Note (Signed)
Anticipate sinusitis leading to some of these symptoms - treat for bacterial cause given duration. augmentin 10 day course. cheratussin for night time cough. Update if not improving with treatment.

## 2017-04-21 NOTE — Progress Notes (Signed)
BP 132/80 (BP Location: Left Arm, Patient Position: Sitting, Cuff Size: Normal)   Pulse 94   Temp 98 F (36.7 C) (Oral)   Wt 261 lb (118.4 kg)   SpO2 97%   BMI 33.51 kg/m    CC: cough x 8 wks Subjective:    Patient ID: Jason Cooper, male    DOB: 1946-03-24, 72 y.o.   MRN: [id number]  HPI: Jason Cooper is a 72 y.o. male presenting on 04/21/2017 for Cough (Off and on for 8 wks. Sometimes coughs all night. Occasional laryngitis. No fever. Tried OTC cough meds, no help. Receiving CA tx with [facility]. Provided latest blood work and CT.)   8 wk h/o cough started with URI sxs (congestion, ST, hoarse voic). URI sxs resolved, cough persists. Mild rhinorrhea. Cough keeps him up at night. OTC cough med doesn't help. No fevers/chills,   He is on lisinopril 5mg  daily for hypertension.  No sick contacts at home He did receive flu shot.   Known esophageal carcinoma receiving chemo treatment at [facility] (taxol and ramucirumab). He's also received Keytruda. Reviewed latest labs (WBC 3.7) and CT imaging - new 31mm pulm nodule R lung apex, stable 8mm nodule LLL, stable consolidation at medial left lower lobe likely sequela of prior aspiration or infection.   Relevant past medical, surgical, family and social history reviewed and updated as indicated. Interim medical history since our last visit reviewed. Allergies and medications reviewed and updated. Outpatient Medications Prior to Visit  Medication Sig Dispense Refill  . Cyanocobalamin (B-12 PO) Take 500 mcg by mouth daily.    . Ferrous Sulfate (SLOW RELEASE IRON PO) Take 45 mg by mouth daily.     . Multiple Vitamin (MULTIVITAMIN) capsule Take 1 capsule by mouth daily.    . ondansetron (ZOFRAN) 8 MG tablet Take 8 mg by mouth every 8 (eight) hours as needed for nausea or vomiting.    Marland Kitchen PACLitaxel (TAXOL IV) Inject into the vein every 21 ( twenty-one) days.    . pantoprazole (PROTONIX) 40 MG tablet Take 40 mg by mouth 2 (two) times daily before a  meal.     . ramucirumab in sodium chloride 0.9 % Inject into the vein every 21 ( twenty-one) days.    . rivaroxaban (XARELTO) 20 MG TABS tablet Take 20 mg by mouth daily.    . simvastatin (ZOCOR) 40 MG tablet Take 40 mg by mouth at bedtime.     Marland Kitchen lisinopril (PRINIVIL,ZESTRIL) 5 MG tablet Take 5 mg by mouth daily.    . Investigational - Study Medication Study name: [id number] Additional study details: IV Infusion    . sodium chloride 0.9 % SOLN 50 mL with pembrolizumab 100 MG/4ML SOLN 2 mg/kg Inject 2 mg/kg into the vein.     No facility-administered medications prior to visit.      Per HPI unless specifically indicated in ROS section below Review of Systems     Objective:    BP 132/80 (BP Location: Left Arm, Patient Position: Sitting, Cuff Size: Normal)   Pulse 94   Temp 98 F (36.7 C) (Oral)   Wt 261 lb (118.4 kg)   SpO2 97%   BMI 33.51 kg/m   Wt Readings from Last 3 Encounters:  04/21/17 261 lb (118.4 kg)  01/27/17 272 lb 8 oz (123.6 kg)  08/26/16 267 lb 12.8 oz (121.5 kg)    Physical Exam  Constitutional: He appears well-developed and well-nourished. No distress.  HENT:  Head: Normocephalic and atraumatic.  Right Ear: Hearing, tympanic membrane, external ear and ear canal normal.  Left Ear: Hearing, tympanic membrane, external ear and ear canal normal.  Nose: Mucosal edema (nasal mucosal congestion with purulent mucous present) present. No rhinorrhea. Right sinus exhibits no maxillary sinus tenderness and no frontal sinus tenderness. Left sinus exhibits no maxillary sinus tenderness and no frontal sinus tenderness.  Mouth/Throat: Uvula is midline, oropharynx is clear and moist and mucous membranes are normal. No oropharyngeal exudate, posterior oropharyngeal edema, posterior oropharyngeal erythema or tonsillar abscesses.  Eyes: Conjunctivae and EOM are normal. Pupils are equal, round, and reactive to light. No scleral icterus.  Neck: Normal range of motion. Neck supple.    Cardiovascular: Normal rate, regular rhythm, normal heart sounds and intact distal pulses.  No murmur heard. Pulmonary/Chest: Effort normal and breath sounds normal. No respiratory distress. He has no wheezes. He has no rales.  Lungs clear, slightly diminished breath sounds LLL   Lymphadenopathy:    He has no cervical adenopathy.  Skin: Skin is warm and dry. No rash noted.  Nursing note and vitals reviewed.  Results for orders placed or performed in visit on 01/24/17  TSH  Result Value Ref Range   TSH 0.36 0.35 - 4.50 uIU/mL  Hemoglobin A1c  Result Value Ref Range   Hgb A1c MFr Bld 6.8 (H) 4.6 - 6.5 %  LDL Cholesterol, Direct  Result Value Ref Range   Direct LDL 108.0 mg/dL  T4, free  Result Value Ref Range   Free T4 0.94 0.60 - 1.60 ng/dL  PSA, Total with Reflex to PSA, Free  Result Value Ref Range   PSA, Total 5.2 (H) < OR = 4.0 ng/mL  reflex PSA, Free  Result Value Ref Range   PSA, Free 1.1 ng/mL   PSA, % Free 21 (L) >25 % (calc)      Assessment & Plan:   Problem List Items Addressed This Visit    Cough    [facility]  Anticipate sinusitis related. Possible ACEI contribution - will switch lisinopril to losartan 25mg . Reviewed recent recall list, Losartan I've prescribed should not be affected.       Malignant neoplasm of lower third of esophagus (HCC)   Relevant Medications   ramucirumab in sodium chloride 0.9 %   PACLitaxel (TAXOL IV)   amoxicillin-clavulanate (AUGMENTIN) 875-125 MG tablet   Subacute sinusitis - Primary    Anticipate sinusitis leading to some of these symptoms - treat for bacterial cause given duration. augmentin 10 day course. cheratussin for night time cough. Update if not improving with treatment.       Relevant Medications   amoxicillin-clavulanate (AUGMENTIN) 875-125 MG tablet   guaiFENesin-codeine (CHERATUSSIN AC) 100-10 MG/5ML syrup       Follow up plan: Return if symptoms worsen or fail to improve.  Ria Bush, MD

## 2017-04-26 ENCOUNTER — Ambulatory Visit: Payer: Self-pay | Admitting: Primary Care

## 2017-06-02 HISTORY — PX: ESOPHAGOGASTRODUODENOSCOPY: SHX1529

## 2017-06-16 ENCOUNTER — Encounter: Payer: Self-pay | Admitting: Family Medicine

## 2017-06-17 ENCOUNTER — Encounter: Payer: Self-pay | Admitting: Family Medicine

## 2017-07-26 ENCOUNTER — Ambulatory Visit: Payer: Medicare Other

## 2017-07-27 ENCOUNTER — Other Ambulatory Visit: Payer: Medicare Other

## 2017-07-27 ENCOUNTER — Ambulatory Visit (INDEPENDENT_AMBULATORY_CARE_PROVIDER_SITE_OTHER): Payer: Medicare Other

## 2017-07-27 ENCOUNTER — Other Ambulatory Visit: Payer: Self-pay | Admitting: Family Medicine

## 2017-07-27 VITALS — BP 130/90 | HR 70 | Temp 99.4°F | Ht 74.5 in | Wt 232.5 lb

## 2017-07-27 DIAGNOSIS — E0590 Thyrotoxicosis, unspecified without thyrotoxic crisis or storm: Secondary | ICD-10-CM

## 2017-07-27 DIAGNOSIS — E785 Hyperlipidemia, unspecified: Secondary | ICD-10-CM

## 2017-07-27 DIAGNOSIS — E119 Type 2 diabetes mellitus without complications: Secondary | ICD-10-CM

## 2017-07-27 DIAGNOSIS — Z0000 Encounter for general adult medical examination without abnormal findings: Secondary | ICD-10-CM | POA: Diagnosis not present

## 2017-07-27 DIAGNOSIS — R972 Elevated prostate specific antigen [PSA]: Secondary | ICD-10-CM | POA: Diagnosis not present

## 2017-07-27 DIAGNOSIS — D72810 Lymphocytopenia: Secondary | ICD-10-CM

## 2017-07-27 LAB — COMPREHENSIVE METABOLIC PANEL
ALT: 25 U/L (ref 0–53)
AST: 38 U/L — AB (ref 0–37)
Albumin: 3.2 g/dL — ABNORMAL LOW (ref 3.5–5.2)
Alkaline Phosphatase: 115 U/L (ref 39–117)
BILIRUBIN TOTAL: 0.9 mg/dL (ref 0.2–1.2)
BUN: 9 mg/dL (ref 6–23)
CALCIUM: 8.8 mg/dL (ref 8.4–10.5)
CO2: 27 meq/L (ref 19–32)
CREATININE: 0.77 mg/dL (ref 0.40–1.50)
Chloride: 101 mEq/L (ref 96–112)
GFR: 105.88 mL/min (ref >60.00)
Glucose, Bld: 94 mg/dL (ref 70–99)
Potassium: 4.6 mEq/L (ref 3.5–5.1)
Sodium: 135 mEq/L (ref 135–145)
TOTAL PROTEIN: 5.7 g/dL — AB (ref 6.0–8.3)

## 2017-07-27 LAB — CBC WITH DIFFERENTIAL/PLATELET
BASOS ABS: 0 10*3/uL (ref 0.0–0.1)
BASOS PCT: 0.9 % (ref 0.0–3.0)
EOS ABS: 0 10*3/uL (ref 0.0–0.7)
Eosinophils Relative: 0.5 % (ref 0.0–5.0)
HEMATOCRIT: 42 % (ref 39.0–52.0)
Hemoglobin: 14.1 g/dL (ref 13.0–17.0)
Lymphocytes Relative: 3.4 % — ABNORMAL LOW (ref 12.0–46.0)
Lymphs Abs: 0.1 10*3/uL — ABNORMAL LOW (ref 0.7–4.0)
MCHC: 33.6 g/dL (ref 30.0–36.0)
MCV: 91.6 fl (ref 78.0–100.0)
MONOS PCT: 29 % — AB (ref 3.0–12.0)
Monocytes Absolute: 1.2 10*3/uL — ABNORMAL HIGH (ref 0.1–1.0)
Neutro Abs: 2.8 10*3/uL (ref 1.4–7.7)
Neutrophils Relative %: 66.2 % (ref 43.0–77.0)
Platelets: 146 10*3/uL — ABNORMAL LOW (ref 150.0–400.0)
RBC: 4.59 Mil/uL (ref 4.22–5.81)
RDW: 19.1 % — ABNORMAL HIGH (ref 11.5–15.5)
WBC: 4.3 10*3/uL (ref 4.0–10.5)

## 2017-07-27 LAB — LIPID PANEL
CHOL/HDL RATIO: 4
Cholesterol: 138 mg/dL (ref 0–200)
HDL: 36.7 mg/dL — ABNORMAL LOW (ref >39.00)
LDL Cholesterol: 80 mg/dL (ref 0–99)
NonHDL: 101.55
TRIGLYCERIDES: 106 mg/dL (ref 0.0–149.0)
VLDL: 21.2 mg/dL (ref 0.0–40.0)

## 2017-07-27 LAB — TSH: TSH: 0.35 u[IU]/mL (ref 0.35–4.50)

## 2017-07-27 LAB — T4, FREE: FREE T4: 1.35 ng/dL (ref 0.60–1.60)

## 2017-07-27 LAB — HEMOGLOBIN A1C: Hgb A1c MFr Bld: 6 % (ref 4.6–6.5)

## 2017-07-27 LAB — PSA: PSA: 7.19 ng/mL — ABNORMAL HIGH (ref 0.10–4.00)

## 2017-07-27 NOTE — Patient Instructions (Signed)
Jason Cooper , Thank you for taking time to come for your Medicare Wellness Visit. I appreciate your ongoing commitment to your health goals. Please review the following plan we discussed and let me know if I can assist you in the future.   These are the goals we discussed: Goals    . DIET - INCREASE WATER INTAKE     Starting 07/27/2017, I will continue to drink at least 6-8 glasses of water daily.        This is a list of the screening recommended for you and due dates:  Health Maintenance  Topic Date Due  . Colon Cancer Screening  08/02/2018*  . Flu Shot  11/02/2017  . Hemoglobin A1C  01/26/2018  . Complete foot exam   01/27/2018  . Eye exam for diabetics  06/03/2018  . DTaP/Tdap/Td vaccine (2 - Td) 07/03/2021  . Tetanus Vaccine  07/03/2021  .  Hepatitis C: One time screening is recommended by Center for Disease Control  (CDC) for  adults born from [birth year] through 1964.   Completed  . Pneumonia vaccines  Completed  *Topic was postponed. The date shown is not the original due date.   Preventive Care for Adults  A healthy lifestyle and preventive care can promote health and wellness. Preventive health guidelines for adults include the following key practices.  . A routine yearly physical is a good way to check with your health care provider about your health and preventive screening. It is a chance to share any concerns and updates on your health and to receive a thorough exam.  . Visit your dentist for a routine exam and preventive care every 6 months. Brush your teeth twice a day and floss once a day. Good oral hygiene prevents tooth decay and gum disease.  . The frequency of eye exams is based on your age, health, family medical history, use  of contact lenses, and other factors. Follow your health care provider's recommendations for frequency of eye exams.  . Eat a healthy diet. Foods like vegetables, fruits, whole grains, low-fat dairy products, and lean protein foods contain the  nutrients you need without too many calories. Decrease your intake of foods high in solid fats, added sugars, and salt. Eat the right amount of calories for you. Get information about a proper diet from your health care provider, if necessary.  . Regular physical exercise is one of the most important things you can do for your health. Most adults should get at least 150 minutes of moderate-intensity exercise (any activity that increases your heart rate and causes you to sweat) each week. In addition, most adults need muscle-strengthening exercises on 2 or more days a week.  Silver Sneakers may be a benefit available to you. To determine eligibility, you may visit the website: www.silversneakers.com or contact program at (856)659-9231 Mon-Fri between 8AM-8PM.   . Maintain a healthy weight. The body mass index (BMI) is a screening tool to identify possible weight problems. It provides an estimate of body fat based on height and weight. Your health care provider can find your BMI and can help you achieve or maintain a healthy weight.   For adults 20 years and older: ? A BMI below 18.5 is considered underweight. ? A BMI of 18.5 to 24.9 is normal. ? A BMI of 25 to 29.9 is considered overweight. ? A BMI of 30 and above is considered obese.   . Maintain normal blood lipids and cholesterol levels by exercising and minimizing your  intake of saturated fat. Eat a balanced diet with plenty of fruit and vegetables. Blood tests for lipids and cholesterol should begin at age 86 and be repeated every 5 years. If your lipid or cholesterol levels are high, you are over 50, or you are at high risk for heart disease, you may need your cholesterol levels checked more frequently. Ongoing high lipid and cholesterol levels should be treated with medicines if diet and exercise are not working.  . If you smoke, find out from your health care provider how to quit. If you do not use tobacco, please do not start.  . If you  choose to drink alcohol, please do not consume more than 2 drinks per day. One drink is considered to be 12 ounces (355 mL) of beer, 5 ounces (148 mL) of wine, or 1.5 ounces (44 mL) of liquor.  . If you are 34-84 years old, ask your health care provider if you should take aspirin to prevent strokes.  . Use sunscreen. Apply sunscreen liberally and repeatedly throughout the day. You should seek shade when your shadow is shorter than you. Protect yourself by wearing long sleeves, pants, a wide-brimmed hat, and sunglasses year round, whenever you are outdoors.  . Once a month, do a whole body skin exam, using a mirror to look at the skin on your back. Tell your health care provider of new moles, moles that have irregular borders, moles that are larger than a pencil eraser, or moles that have changed in shape or color.

## 2017-07-27 NOTE — Progress Notes (Signed)
Subjective:   Jason Cooper is a 72 y.o. male who presents for Medicare Annual/Subsequent preventive examination.  Review of Systems:  N/A Cardiac Risk Factors include: advanced age (>44men, >13 women);male gender;dyslipidemia;hypertension     Objective:    Vitals: BP 130/90 (BP Location: Right Arm, Patient Position: Sitting, Cuff Size: Normal)   Pulse 70   Temp 99.4 F (37.4 C) (Oral)   Ht 6' 2.5" (1.892 m) Comment: shoes  Wt 232 lb 8 oz (105.5 kg)   SpO2 95%   BMI 29.45 kg/m   Body mass index is 29.45 kg/m.  Advanced Directives 07/27/2017 07/22/2016 09/15/2015 09/15/2015 09/09/2015  Does Patient Have a Medical Advance Directive? Yes Yes Yes Yes Yes  Type of Paramedic of [place];Living will [place];Living will [place];Living will - [place];Living will  Does patient want to make changes to medical advance directive? - - No - Patient declined (No Data) -  Copy of [place] in Chart? Yes No - copy requested No - copy requested - -    Tobacco Social History   Tobacco Use  Smoking Status Former Smoker  . Types: Cigarettes  . Last attempt to quit: 06/02/1997  . Years since quitting: 20.1  Smokeless Tobacco Never Used     Counseling given: No   Clinical Intake:  Pre-visit preparation completed: Yes  Pain : No/denies pain Pain Score: 0-No pain     Nutritional Status: BMI 25 -29 Overweight Nutritional Risks: Unintentional weight loss(weight loss due to side effects of radiation) Diabetes: Yes CBG done?: No Did pt. bring in CBG monitor from home?: No  How often do you need to have someone help you when you read instructions, pamphlets, or other written materials from your doctor or pharmacy?: 1 - Never What is the last grade level you completed in school?: Bachelors degree + graduate courses  Interpreter Needed?: No  Comments: pt lives with spouse    Information entered by :: LPinson, LPN  Past Medical History:  Diagnosis Date  . Acute pulmonary embolism ([place]) 11/28/2015   Incidentally found on CT 11/2015, started lovenox  . Cluster headache    as 72 yo, no more since retired (25 yrs ago)  . Coronary artery disease   . Diabetes mellitus without complication ([place])   . Environmental allergies   . H/O malaria 1969   during [place] (hospitalized in [place])  . H/O varicella   . History of chicken pox   . HLD (hyperlipidemia)   . HTN (hypertension)   . 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 Osteoarthritis    s/p bilat knee replacements  . Prediabetes 2007  . Seasonal allergic rhinitis    Past Surgical History:  Procedure Laterality Date  . ANKLE SURGERY Left 11-1999   haglin deformity [name])  . COLONOSCOPY  2008   WNL, rec rpt 10 yrs [name])  . 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 ESOPHAGOGASTRODUODENOSCOPY (EGD) WITH PROPOFOL N/A 09/09/2015   Procedure: ESOPHAGOGASTRODUODENOSCOPY (EGD) WITH PROPOFOL;  Surgeon: Manya Silvas, MD;  Location: [facility] ENDOSCOPY;  Service: Endoscopy;  Laterality: N/A;  . JOINT REPLACEMENT Right 02/2002  . PORTACATH PLACEMENT  09/2015  . TOTAL KNEE ARTHROPLASTY Bilateral 2003, 2014   right-2003; left-2014   Family History  Problem Relation Age  of Onset  . Hypertension Mother   . Stroke Mother   . Thyroid disease Mother        needed it removed, unsure why  . Stroke Other        maternal and paternal grandparents  . Hypertension Father   . Diabetes Father   . Cancer Paternal Aunt        bone  . Cancer Paternal Aunt        pancreas  . Cancer Cousin        prostate  . Cancer Sister   . CAD Neg Hx    Social History   Socioeconomic History  . Marital status: Married    Spouse name: Not on file  . Number of children:  Not on file  . Years of education: Not on file  . Highest education level: Not on file  Occupational History  . Not on file  Social Needs  . Financial resource strain: Not on file  . Food insecurity:    Worry: Not on file    Inability: Not on file  . Transportation needs:    Medical: Not on file    Non-medical: Not on file  Tobacco Use  . Smoking status: Former Smoker    Types: Cigarettes    Last attempt to quit: 06/02/1997    Years since quitting: 20.1  . Smokeless tobacco: Never Used  Substance and Sexual Activity  . Alcohol use: Yes    Alcohol/week: 0.0 oz  . Drug use: No  . Sexual activity: Yes  Lifestyle  . Physical activity:    Days per week: Not on file    Minutes per session: Not on file  . Stress: Not on file  Relationships  . Social connections:    Talks on phone: Not on file    Gets together: Not on file    Attends religious service: Not on file    Active member of club or organization: Not on file    Attends meetings of clubs or organizations: Not on file    Relationship status: Not on file  Other Topics Concern  . Not on file  Social History Narrative   Lives with wife, no pets   Occupation: retired Engineer, structural   Edu: graduate degree   Activity: enjoys traveling, rides stationary bike every morning 30 min, walking 2 mi/day, [facility] to [facility]   Diet: good water, fruits/vegetables daily    Outpatient Encounter Medications as of 07/27/2017  Medication Sig  . Cyanocobalamin (B-12 PO) Take 500 mcg by mouth daily.  . Ferrous Sulfate (SLOW RELEASE IRON PO) Take 45 mg by mouth daily.   Marland Kitchen guaiFENesin-codeine (CHERATUSSIN AC) 100-10 MG/5ML syrup Take 5 mLs by mouth 2 (two) times daily as needed for cough (sedation precautions).  . Multiple Vitamin (MULTIVITAMIN) capsule Take 1 capsule by mouth daily.  . ondansetron (ZOFRAN) 8 MG tablet Take 8 mg by mouth every 8 (eight) hours as needed for nausea or vomiting.  Marland Kitchen PACLitaxel (TAXOL IV) Inject into the vein every 21 (  twenty-one) days.  . pantoprazole (PROTONIX) 40 MG tablet Take 40 mg by mouth 2 (two) times daily before a meal.   . ramucirumab in sodium chloride 0.9 % Inject into the vein every 21 ( twenty-one) days.  . rivaroxaban (XARELTO) 20 MG TABS tablet Take 20 mg by mouth daily.  . simvastatin (ZOCOR) 40 MG tablet Take 40 mg by mouth at bedtime.   . [DISCONTINUED] losartan (COZAAR) 25 MG tablet Take  1 tablet (25 mg total) by mouth daily.   No facility-administered encounter medications on file as of 07/27/2017.     Activities of Daily Living In your present state of health, do you have any difficulty performing the following activities: 07/27/2017  Hearing? N  Vision? N  Difficulty concentrating or making decisions? N  Walking or climbing stairs? N  Dressing or bathing? N  Doing errands, shopping? N  Preparing Food and eating ? N  Using the Toilet? N  In the past six months, have you accidently leaked urine? N  Do you have problems with loss of bowel control? N  Managing your Medications? N  Managing your Finances? N  Housekeeping or managing your Housekeeping? N  Some recent data might be hidden    Patient Care Team: Ria Bush, MD as PCP - General (Family Medicine)   Assessment:   This is a routine wellness examination for Jason Cooper.   Hearing Screening   125Hz  250Hz  500Hz  1000Hz  2000Hz  3000Hz  4000Hz  6000Hz  8000Hz   Right ear:   40 40 40  0    Left ear:   40 40 40  40    Vision Screening Comments: Last vision exam in Mar 2019 @ [facility]   Exercise Activities and Dietary recommendations Current Exercise Habits: The patient does not participate in regular exercise at present, Exercise limited by: Other - see comments(cancer treatment with radiation)  Goals    . DIET - INCREASE WATER INTAKE     Starting 07/27/2017, I will continue to drink at least 6-8 glasses of water daily.        Fall Risk Fall Risk  07/27/2017 07/22/2016 07/15/2015 07/11/2014 07/09/2013  Falls in the past year?  No No No No No   Depression Screen PHQ 2/9 Scores 07/27/2017 07/22/2016 07/15/2015 07/11/2014  PHQ - 2 Score 0 0 0 0  PHQ- 9 Score 0 - - -    Cognitive Function MMSE - Mini Mental State Exam 07/27/2017 07/22/2016  Orientation to time 5 5  Orientation to Place 5 5  Registration 3 3  Attention/ Calculation 0 0  Recall 3 3  Language- name 2 objects 0 0  Language- repeat 1 1  Language- follow 3 step command 3 3  Language- read & follow direction 0 0  Write a sentence 0 0  Copy design 0 0  Total score 20 20     PLEASE NOTE: A Mini-Cog screen was completed. Maximum score is 20. A value of 0 denotes this part of [place] MMSE was not completed or the patient failed this part of the Mini-Cog screening.   Mini-Cog Screening Orientation to Time - Max 5 pts Orientation to Place - Max 5 pts Registration - Max 3 pts Recall - Max 3 pts Language Repeat - Max 1 pts Language Follow 3 Step Command - Max 3 pts     Immunization History  Administered Date(s) Administered  . Influenza Whole 01/02/2013  . Influenza, High Dose Seasonal PF 01/04/2016, 12/26/2016  . Influenza, Seasonal, Injecte, Preservative Fre 12/15/2013  . Influenza-Unspecified 12/19/2014  . Pneumococcal Conjugate-13 07/09/2013  . Pneumococcal Polysaccharide-23 04/04/2009, 05/06/2015  . Td 04/04/2009  . Tdap 07/04/2011  . Zoster 04/04/2006    Screening Tests Health Maintenance  Topic Date Due  . COLONOSCOPY  08/02/2018 (Originally 04/04/2016)  . INFLUENZA VACCINE  11/02/2017  . HEMOGLOBIN A1C  01/26/2018  . FOOT EXAM  01/27/2018  . OPHTHALMOLOGY EXAM  06/03/2018  . DTaP/Tdap/Td (2 - Td) 07/03/2021  . TETANUS/TDAP  07/03/2021  . Hepatitis C Screening  Completed  . PNA vac Low Risk Adult  Completed       Plan:     I have personally reviewed, addressed, and noted the following in the patient's chart:  A. Medical and social history B. Use of alcohol, tobacco or illicit drugs  C. Current medications and  supplements D. Functional ability and status E.  Nutritional status F.  Physical activity G. Advance directives H. List of other physicians I.  Hospitalizations, surgeries, and ER visits in previous 12 months J.  [place] to include hearing, vision, cognitive, depression L. Referrals and appointments - none  In addition, I have reviewed and discussed with patient certain preventive protocols, quality metrics, and best practice recommendations. A written personalized care plan for preventive services as well as general preventive health recommendations were provided to patient.  See attached scanned questionnaire for additional information.   Signed,   Lindell Noe, MHA, BS, LPN Health Coach

## 2017-07-27 NOTE — Progress Notes (Signed)
PCP notes:   Health maintenance:  Microalbumin - pt will bring sample to next appt with PCP A1C - completed Eye exam - per pt, exam in Mar 2019 at [facility]  Abnormal screenings:   Hearing - failed  Hearing Screening   125Hz  250Hz  500Hz  1000Hz  2000Hz  3000Hz  4000Hz  6000Hz  8000Hz   Right ear:   40 40 40  0    Left ear:   40 40 40  40     Patient concerns:   None  Nurse concerns:  None  Next PCP appt:   08/31/17 @ 1130

## 2017-07-28 LAB — PATHOLOGIST SMEAR REVIEW

## 2017-08-01 DIAGNOSIS — E43 Unspecified severe protein-calorie malnutrition: Secondary | ICD-10-CM | POA: Insufficient documentation

## 2017-08-01 DIAGNOSIS — R432 Parageusia: Secondary | ICD-10-CM | POA: Insufficient documentation

## 2017-08-01 NOTE — Progress Notes (Signed)
I reviewed health advisor's note, was available for consultation, and agree with documentation and plan.  

## 2017-08-02 ENCOUNTER — Emergency Department
Admission: EM | Admit: 2017-08-02 | Discharge: 2017-08-02 | Disposition: A | Discharge status: Home / Self Care | Payer: Medicare Other | Attending: Emergency Medicine | Admitting: Emergency Medicine

## 2017-08-02 ENCOUNTER — Encounter: Payer: Self-pay | Admitting: *Deleted

## 2017-08-02 ENCOUNTER — Emergency Department: Payer: Medicare Other

## 2017-08-02 DIAGNOSIS — Z79899 Other long term (current) drug therapy: Secondary | ICD-10-CM | POA: Insufficient documentation

## 2017-08-02 DIAGNOSIS — R569 Unspecified convulsions: Secondary | ICD-10-CM

## 2017-08-02 DIAGNOSIS — Z87891 Personal history of nicotine dependence: Secondary | ICD-10-CM | POA: Insufficient documentation

## 2017-08-02 DIAGNOSIS — Z96653 Presence of artificial knee joint, bilateral: Secondary | ICD-10-CM | POA: Diagnosis not present

## 2017-08-02 DIAGNOSIS — I2510 Atherosclerotic heart disease of native coronary artery without angina pectoris: Secondary | ICD-10-CM | POA: Diagnosis not present

## 2017-08-02 DIAGNOSIS — E119 Type 2 diabetes mellitus without complications: Secondary | ICD-10-CM | POA: Diagnosis not present

## 2017-08-02 DIAGNOSIS — Z7901 Long term (current) use of anticoagulants: Secondary | ICD-10-CM | POA: Diagnosis not present

## 2017-08-02 DIAGNOSIS — I10 Essential (primary) hypertension: Secondary | ICD-10-CM | POA: Insufficient documentation

## 2017-08-02 HISTORY — DX: Acute embolism and thrombosis of unspecified deep veins of unspecified lower extremity: I82.409

## 2017-08-02 LAB — PROTIME-INR
INR: 1.26
Prothrombin Time: 15.7 seconds — ABNORMAL HIGH (ref 11.4–15.2)

## 2017-08-02 LAB — CBC WITH DIFFERENTIAL/PLATELET
BASOS ABS: 0 10*3/uL (ref 0–0.1)
Basophils Relative: 0 %
EOS ABS: 0 10*3/uL (ref 0–0.7)
Eosinophils Relative: 1 %
HCT: 42.4 % (ref 40.0–52.0)
HEMOGLOBIN: 14.3 g/dL (ref 13.0–18.0)
LYMPHS ABS: 0.5 10*3/uL — AB (ref 1.0–3.6)
Lymphocytes Relative: 6 %
MCH: 30.8 pg (ref 26.0–34.0)
MCHC: 33.7 g/dL (ref 32.0–36.0)
MCV: 91.6 fL (ref 80.0–100.0)
Monocytes Absolute: 1.1 10*3/uL — ABNORMAL HIGH (ref 0.2–1.0)
Monocytes Relative: 13 %
NEUTROS PCT: 80 %
Neutro Abs: 6.7 10*3/uL — ABNORMAL HIGH (ref 1.4–6.5)
Platelets: 224 10*3/uL (ref 150–440)
RBC: 4.63 MIL/uL (ref 4.40–5.90)
RDW: 19.1 % — ABNORMAL HIGH (ref 11.5–14.5)
WBC: 8.3 10*3/uL (ref 3.8–10.6)

## 2017-08-02 LAB — COMPREHENSIVE METABOLIC PANEL
ALBUMIN: 3.1 g/dL — AB (ref 3.5–5.0)
ALK PHOS: 139 U/L — AB (ref 38–126)
ALT: 45 U/L (ref 17–63)
AST: 78 U/L — ABNORMAL HIGH (ref 15–41)
Anion gap: 13 (ref 5–15)
BUN: 10 mg/dL (ref 6–20)
CALCIUM: 9 mg/dL (ref 8.9–10.3)
CHLORIDE: 101 mmol/L (ref 101–111)
CO2: 23 mmol/L (ref 22–32)
CREATININE: 0.85 mg/dL (ref 0.61–1.24)
GFR calc Af Amer: >60 mL/min (ref >60)
GFR calc non Af Amer: >60 mL/min (ref >60)
GLUCOSE: 158 mg/dL — AB (ref 65–99)
Potassium: 3.8 mmol/L (ref 3.5–5.1)
SODIUM: 137 mmol/L (ref 135–145)
Total Bilirubin: 0.8 mg/dL (ref 0.3–1.2)
Total Protein: 6.5 g/dL (ref 6.5–8.1)

## 2017-08-02 LAB — APTT: aPTT: 27 seconds (ref 24–36)

## 2017-08-02 LAB — MAGNESIUM: Magnesium: 1.8 mg/dL (ref 1.7–2.4)

## 2017-08-02 MED ORDER — LIDOCAINE HCL 1 % IJ SOLN
0.50 | INTRAMUSCULAR | Status: DC
Start: ? — End: 2017-08-02

## 2017-08-02 MED ORDER — GENERIC EXTERNAL MEDICATION
6.25 | Status: DC
Start: ? — End: 2017-08-02

## 2017-08-02 MED ORDER — LOSARTAN POTASSIUM 50 MG PO TABS
25.00 | ORAL_TABLET | ORAL | Status: DC
Start: 2017-08-02 — End: 2017-08-02

## 2017-08-02 MED ORDER — PANTOPRAZOLE SODIUM 40 MG PO TBEC
40.00 | DELAYED_RELEASE_TABLET | ORAL | Status: DC
Start: 2017-08-01 — End: 2017-08-02

## 2017-08-02 MED ORDER — GABAPENTIN 300 MG PO CAPS
300.00 | ORAL_CAPSULE | ORAL | Status: DC
Start: 2017-08-01 — End: 2017-08-02

## 2017-08-02 MED ORDER — RIVAROXABAN 20 MG PO TABS
20.00 | ORAL_TABLET | ORAL | Status: DC
Start: 2017-08-02 — End: 2017-08-02

## 2017-08-02 MED ORDER — ONDANSETRON HCL-NACL 8-0.9 MG/50ML-% IV SOLN
8.00 | INTRAVENOUS | Status: DC
Start: 2017-08-01 — End: 2017-08-02

## 2017-08-02 MED ORDER — IOPAMIDOL (ISOVUE-300) INJECTION 61%
100.0000 mL | Freq: Once | INTRAVENOUS | Status: AC | PRN
  Administered 2017-08-02: 100 mL via INTRAVENOUS

## 2017-08-02 MED ORDER — FIRST-MOUTHWASH BLM MT SUSP
5.00 | OROMUCOSAL | Status: DC
Start: 2017-08-01 — End: 2017-08-02

## 2017-08-02 MED ORDER — POLYETHYLENE GLYCOL 3350 17 G PO PACK
17.00 | PACK | ORAL | Status: DC
Start: ? — End: 2017-08-02

## 2017-08-02 MED ORDER — OLANZAPINE 5 MG PO TBDP
5.00 | ORAL_TABLET | ORAL | Status: DC
Start: 2017-08-01 — End: 2017-08-02

## 2017-08-02 MED ORDER — SUCRALFATE 1 GM/10ML PO SUSP
1.00 | ORAL | Status: DC
Start: 2017-08-01 — End: 2017-08-02

## 2017-08-02 MED ORDER — DEXAMETHASONE SODIUM PHOSPHATE 4 MG/ML IJ SOLN
2.00 | INTRAMUSCULAR | Status: DC
Start: 2017-08-01 — End: 2017-08-02

## 2017-08-02 MED ORDER — SENNOSIDES-DOCUSATE SODIUM 8.6-50 MG PO TABS
2.00 | ORAL_TABLET | ORAL | Status: DC
Start: ? — End: 2017-08-02

## 2017-08-02 NOTE — Discharge Instructions (Signed)
You have been seen in the emergency department today for a likely seizure.  Your workup today including labs are within normal limits.  Your head CT (with and without contrast) was normal with no evidence of bleeding nor metastases.   Please follow up with your doctor as soon as possible regarding today's emergency department visit and your likely seizure.  Dr. Danise Mina and/or Dr. Fanny Skates may be able to facilitate close outpatient follow up with a [facility] neurologist.  Because there was no clear cause of your seizure, it may be medication related, but we recommend you continue taking your prescribed medications for now until you can follow up with your doctor(s).  It is very important that you DO NOT drive until you have been seen and cleared by a neurologist.  Return to the emergency department if you have any further seizures, develop any weakness/numbness of any arm/leg, confusion, slurred speech, or sudden/severe headache.

## 2017-08-02 NOTE — ED Notes (Signed)
Blue top hemolyzed per lab, redrawn at this time and sent down.

## 2017-08-02 NOTE — ED Notes (Signed)
Pt is back from CT, family remains at the bedside.

## 2017-08-02 NOTE — ED Provider Notes (Signed)
[facility] Emergency Department Provider Note  ____________________________________________   First MD Initiated Contact with Patient 08/02/17 0145     (approximate)  I have reviewed the triage vital signs and the nursing notes.   HISTORY  Chief Complaint Seizures    HPI Jason Cooper is a 72 y.o. 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  He has no history of seizures.  He presents by EMS for evaluation after what appeared to be a generalized tonic-clonic seizure while he was asleep.  His wife was with him and was awakened as he was shaking all over, eyes rolled up in the back of his head, and unresponsive to her.  This lasted a couple of minutes and then stopped but he remained unresponsive to her (although breathing normally) until EMS arrived.  He remembers being loaded into the truck and brought to the ED.  He remains a little bit confused and was clearly postictal initially according to EMS but is getting back to his baseline.  The onset of his symptoms was acute and they were severe and was the first time he has had a seizure.  Nothing particular made his symptoms better nor worse.  He denies any recent fever/chills and denies having a headache or neck pain nor neck stiffness.  He denies chest pain, shortness of breath, and abdominal pain he did have some nausea and was provided Zofran by EMS.  He was discharged yesterday from [facility] where he receives all of his care after an admission primarily for rehydration due to an inability to take adequate p.o. intake with the esophageal cancer.    Past Medical History:  Diagnosis Date  . Acute pulmonary embolism ([place]) 11/28/2015   Incidentally found on CT 11/2015, started lovenox  . Cluster headache    as 72 yo, no  more since retired (25 yrs ago)  . Coronary artery disease   . DVT (deep venous thrombosis) ([place])    2018  . Environmental allergies   . H/O malaria 1969   during [place] (hospitalized in [place])  . H/O varicella   . History of chicken pox   . HLD (hyperlipidemia)   . HTN (hypertension)   . Malignant neoplasm of lower third of esophagus ([place]) 09/15/2015   Followed at [place] by rad onc and onc   . Osteoarthritis    s/p bilat knee replacements  . Prediabetes 2007  . Seasonal allergic rhinitis     Patient Active Problem List   Diagnosis Date Noted  . Cough 04/21/2017  . Subacute sinusitis 04/21/2017  . Elevated PSA 01/22/2017  . Metastasis from esophageal cancer ([place]) 07/29/2016  . Acute pulmonary embolism ([place]) 11/28/2015  . Malignant neoplasm of lower third of esophagus ([place]) 09/15/2015  . Syncopal episodes 09/15/2015  . Subclinical hyperthyroidism 07/15/2015  . Advanced care planning/counseling discussion 07/11/2014  . Medicare annual wellness visit, subsequent 07/09/2013  . HTN (hypertension)   . HLD (hyperlipidemia)   . Controlled type 2 diabetes mellitus without complication, without long-term current use of insulin ([place])   . Seasonal allergic rhinitis   . Osteoarthritis     Past Surgical History:  Procedure Laterality Date  . ANKLE SURGERY Left 11-1999   haglin deformity [name])  . COLONOSCOPY  2008   WNL, rec rpt 10 yrs [name])  . 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 ESOPHAGOGASTRODUODENOSCOPY (EGD) WITH PROPOFOL N/A 09/09/2015   Procedure: ESOPHAGOGASTRODUODENOSCOPY (EGD) WITH PROPOFOL;  Surgeon: Manya Silvas, MD;  Location: [facility] ENDOSCOPY;  Service: Endoscopy;  Laterality: N/A;  . JOINT REPLACEMENT Right 02/2002  . PORTACATH PLACEMENT  09/2015  . TOTAL KNEE ARTHROPLASTY Bilateral 2003, 2014   right-2003; left-2014    Prior to Admission medications     Medication Sig Start Date End Date Taking? Authorizing Provider  rivaroxaban (XARELTO) 20 MG TABS tablet Take 20 mg by mouth daily.   Yes [provider]  Cyanocobalamin (B-12 PO) Take 500 mcg by mouth daily.    [provider]  Ferrous Sulfate (SLOW RELEASE IRON PO) Take 45 mg by mouth daily.     [provider]  guaiFENesin-codeine (CHERATUSSIN AC) 100-10 MG/5ML syrup Take 5 mLs by mouth 2 (two) times daily as needed for cough (sedation precautions). 04/21/17   Ria Bush, MD  Multiple Vitamin (MULTIVITAMIN) capsule Take 1 capsule by mouth daily.    [provider]  ondansetron (ZOFRAN) 8 MG tablet Take 8 mg by mouth every 8 (eight) hours as needed for nausea or vomiting.    [provider]  PACLitaxel (TAXOL IV) Inject into the vein every 21 ( twenty-one) days.    [provider]  pantoprazole (PROTONIX) 40 MG tablet Take 40 mg by mouth 2 (two) times daily before a meal.     [provider]  ramucirumab in sodium chloride 0.9 % Inject into the vein every 21 ( twenty-one) days.    [provider]  simvastatin (ZOCOR) 40 MG tablet Take 40 mg by mouth at bedtime.     [provider]    Allergies Patient has no known allergies.  Family History  Problem Relation Age of Onset  . Hypertension Mother   . Stroke Mother   . Thyroid disease Mother        needed it removed, unsure why  . Stroke Other        maternal and paternal grandparents  . Hypertension Father   . Diabetes Father   . Cancer Paternal Aunt        bone  . Cancer Paternal Aunt        pancreas  . Cancer Cousin        prostate  . Cancer Sister   . CAD Neg Hx     Social History Social History   Tobacco Use  . Smoking status: Former Smoker    Types: Cigarettes    Last attempt to quit: 06/02/1997    Years since quitting: 20.1  . Smokeless tobacco: Never Used  Substance Use Topics  . Alcohol use: Yes    Alcohol/week: 0.0 oz  . Drug  use: No    Review of Systems Constitutional: No fever/chills Eyes: No visual changes. ENT: No sore throat. Cardiovascular: Denies chest pain. Respiratory: Denies shortness of breath. Gastrointestinal: No abdominal pain.  Nausea, no vomiting.  No diarrhea.  No constipation. Genitourinary: Negative for dysuria. Musculoskeletal: Negative for neck pain.  Negative for back pain. Integumentary: Negative for rash. Neurological: Generalized tonic-clonic seizure as described above.  Currently back to baseline.   ____________________________________________   PHYSICAL EXAM:  VITAL SIGNS: ED Triage Vitals  Enc Vitals Group     BP 08/02/17 0154 (!) 148/88     Pulse Rate 08/02/17 0154 (!) 112     Resp 08/02/17 0154  18     Temp 08/02/17 0154 (!) 97.4 F (36.3 C)     Temp Source 08/02/17 0154 Oral     SpO2 08/02/17 0154 98 %     Weight 08/02/17 0156 99.8 kg (220 lb)     Height --      Head Circumference --      Peak Flow --      Pain Score 08/02/17 0156 0     Pain Loc --      Pain Edu? --      Excl. in [place]? --     Constitutional: Alert and oriented.  Appears to have a degree of chronic medical illness but is in no acute distress Eyes: Conjunctivae are normal. PERRL. EOMI. Head: Atraumatic. Nose: No congestion/rhinnorhea. Mouth/Throat: Mucous membranes are tacky.  No indication of any intraoral injury Neck: No stridor.  No meningeal signs.  No cervical spine tenderness to palpation. Cardiovascular: Tachycardia, regular rhythm. Good peripheral circulation. Grossly normal heart sounds. Respiratory: Normal respiratory effort.  No retractions. Lungs CTAB. Gastrointestinal: Soft and nontender. No distention.  Musculoskeletal: No lower extremity tenderness nor edema. No gross deformities of extremities. Neurologic:  Normal speech and language. No gross focal neurologic deficits are appreciated.  Skin:  Skin is warm, dry and intact. No rash noted. Psychiatric: Mood and affect are normal.  Speech and behavior are normal.  ____________________________________________   LABS (all labs ordered are listed, but only abnormal results are displayed)  Labs Reviewed  CBC WITH DIFFERENTIAL/PLATELET - Abnormal; Notable for the following components:      Result Value   RDW 19.1 (*)    Neutro Abs 6.7 (*)    Lymphs Abs 0.5 (*)    Monocytes Absolute 1.1 (*)    All other components within normal limits  COMPREHENSIVE METABOLIC PANEL - Abnormal; Notable for the following components:   Glucose, Bld 158 (*)    Albumin 3.1 (*)    AST 78 (*)    Alkaline Phosphatase 139 (*)    All other components within normal limits  PROTIME-INR - Abnormal; Notable for the following components:   Prothrombin Time 15.7 (*)    All other components within normal limits  MAGNESIUM  APTT   ____________________________________________  EKG  ED ECG REPORT I, Hinda Kehr, the attending physician, personally viewed and interpreted this ECG.  Date: 08/02/2017 EKG Time: 01:43 Rate: 109 Rhythm: sinus tachycardia QRS Axis: normal Intervals: normal ST/T Wave abnormalities: Non-specific ST segment / T-wave changes, but no evidence of acute ischemia. Narrative Interpretation: no evidence of acute ischemia   ____________________________________________  RADIOLOGY I, Hinda Kehr, personally viewed and evaluated these images (plain radiographs) as part of my medical decision making, as well as reviewing the written report by the radiologist.  ED MD interpretation: No indication of any acute abnormalities on CT scan including both leading and any sign of metastases  Official radiology report(s): Ct Head W Or Wo Contrast  Result Date: 08/02/2017 CLINICAL DATA:  Stage IV esophageal cancer with liver metastases. New onset seizure. EXAM: CT HEAD WITHOUT AND WITH CONTRAST TECHNIQUE: Contiguous axial images were obtained from the base of the skull through the vertex without and with intravenous contrast  CONTRAST:  19mL ISOVUE-300 IOPAMIDOL (ISOVUE-300) INJECTION 61% COMPARISON:  Head CT 09/15/2015 FINDINGS: Brain: No mass lesion, intraparenchymal hemorrhage or extra-axial collection. No evidence of acute cortical infarct. Normal appearance of the brain parenchyma and extra axial spaces for age. No abnormal contrast enhancement. Vascular: No hyperdense vessel or unexpected vascular  calcification. Skull: Normal visualized skull base, calvarium and extracranial soft tissues. Sinuses/Orbits: No sinus fluid levels or advanced mucosal thickening. No mastoid effusion. Normal orbits. IMPRESSION: No acute intracranial abnormality or contrast-enhancing lesion. Electronically Signed   By: Ulyses Jarred M.D.   On: 08/02/2017 02:34    ____________________________________________   PROCEDURES  Critical Care performed: No   Procedure(s) performed:   Procedures   ____________________________________________   INITIAL IMPRESSION / ASSESSMENT AND PLAN / ED COURSE  As part of my medical decision making, I reviewed the following data within the [place] History obtained from family, Nursing notes reviewed and incorporated, Labs reviewed , EKG interpreted , Old chart reviewed and Evaluated by EM attending and discussed by phone with [facility].    Differential diagnosis includes, but is not limited to, seizure secondary to metastases, seizure secondary to intraparenchymal bleed, other nonspecific intracranial mass, electrolyte abnormality, hypoglycemia, acute intoxication, medication side effects.  The patient is returning to his baseline which is reassuring.  I am most concerned about the possibility of either an intracranial bleed given that he is on Xarelto or metastases in the brain.  I looked back through [facility] at [facility] and I see no recent imaging of his head although he has had recent imaging of his chest and abdomen/pelvis.  I will obtain a stat head CT with and without  contrast for the best possible evaluation of subtle metastatic changes as well as acute bleeding.  I looked back through all of his recent lab work and there was never an indication of acute renal failure so I feel it is important to proceed with a CT scan even though his metabolic panel is not yet back.  We have initiated seizure precautions but I will not start any medication yet.  I have ordered 1 L of normal saline given that he is tachycardic and was recently treated for volume depletion in the setting of esophageal cancer.  His family is at the bedside and they agree with the plan.  After his work-up is complete I will likely contact [facility] to see if they want him to be transferred back to their service or follow-up closely as an outpatient, although given his comorbidities I am somewhat concerned about the prospect of sending him back home.  Clinical Course as of Aug 02 425  Wed Aug 02, 2017  0258 Patient's CBC is essentially within normal limits and his metabolic panel is also reassuring with no acute abnormalities to explain a seizure.  His fingerstick blood glucose was within normal limits and his glucose on the metabolic panel was 161.  Magnesium was 1.8.  CT scan was reassuring with no acute abnormalities and patient is feeling well, joking with his little bit, and his heart rate is down to the 80s.  I will attempt to figure out who his primary provider is at [facility] and see if it is possible to contact them to discuss the case and additional recommendations or management plan.   [CF]  [phone] Patient has been stable and asymptomatic for almost 3 hours in the ED with no addition seizure episodes.  He is at his baseline mental status.  Discussed case by phone with Dr. Dahlia Client [facility]).  We discussed the situation in detail.  Neither of [place] can find a reason for urgent/emergent transfer back to [facility], and additionally, [facility] is on diversion, so his transfer would be indefinitely delayed.  As  per standard practice, we also both agreed  that there is no indication to start an AED at this time.  There is no clear indication which of his medications may be the culprit.  Discussed this conversation and plan of care with his family at length.  I also brought up the possibility of admission to this facility for neurology consult, but I explained I did not think that it was likely to change much, particularly given that his care is all provided at [facility] and that they will likely recommend close outpatient follow-up which can be facilitated through his oncologist.  They are comfortable with taking him home tonight and they have multiple family members that can watch after him.  I gave strict return precautions and they will follow-up in the morning with his oncologist's clinic.  No medication changes at this time.  The patient is neurologically and hemodynamically stable at the time of discharge.   [CF]    Clinical Course User Index [CF] Hinda Kehr, MD    ____________________________________________  FINAL CLINICAL IMPRESSION(S) / ED DIAGNOSES  Final diagnoses:  Seizure ([place])     MEDICATIONS GIVEN DURING THIS VISIT:  Medications  iopamidol (ISOVUE-300) 61 % injection 100 mL (100 mLs Intravenous Contrast Given 08/02/17 0201)     ED Discharge Orders    None       Note:  This document was prepared using Dragon voice recognition software and may include unintentional dictation errors.    Hinda Kehr, MD 08/02/17 [phone]

## 2017-08-02 NOTE — ED Triage Notes (Addendum)
Pt arrives by ems.  Pt was d/c from [facility] yesterday where he was hospitalized due to dehydration and inability to take fluids and food related to radiation for CA, recent 40lbs weight loss per family. Pt wife states that this pm they were in bed and pt had a seizure, she states that he "shook all over" and that it was 2 episodes.  Pt has esophageal cancer with mets to the liver.  This is the first time that he had any neurological symptoms, pt is on xarelto due to DVT.  On exam pt has no focal weakness or numbness.  Pt is alert and oriented to self.  He can not tell me the month, he states that the year is 2001 and he does not recall who the president is.  Family states that this is unusual for him.

## 2017-08-02 NOTE — ED Notes (Signed)
Patient transported to CT 

## 2017-08-30 ENCOUNTER — Other Ambulatory Visit: Payer: Self-pay

## 2017-08-30 ENCOUNTER — Emergency Department: Payer: Medicare Other

## 2017-08-30 ENCOUNTER — Inpatient Hospital Stay
Admission: EM | Admit: 2017-08-30 | Discharge: 2017-09-03 | DRG: 481 | Disposition: A | Discharge status: Skilled Nursing Facility | Payer: Medicare Other | Attending: Internal Medicine | Admitting: Internal Medicine

## 2017-08-30 DIAGNOSIS — Y929 Unspecified place or not applicable: Secondary | ICD-10-CM

## 2017-08-30 DIAGNOSIS — I2510 Atherosclerotic heart disease of native coronary artery without angina pectoris: Secondary | ICD-10-CM | POA: Diagnosis present

## 2017-08-30 DIAGNOSIS — E119 Type 2 diabetes mellitus without complications: Secondary | ICD-10-CM | POA: Diagnosis present

## 2017-08-30 DIAGNOSIS — Z923 Personal history of irradiation: Secondary | ICD-10-CM | POA: Diagnosis not present

## 2017-08-30 DIAGNOSIS — E440 Moderate protein-calorie malnutrition: Secondary | ICD-10-CM | POA: Diagnosis present

## 2017-08-30 DIAGNOSIS — C159 Malignant neoplasm of esophagus, unspecified: Secondary | ICD-10-CM | POA: Diagnosis present

## 2017-08-30 DIAGNOSIS — S72141A Displaced intertrochanteric fracture of right femur, initial encounter for closed fracture: Principal | ICD-10-CM | POA: Diagnosis present

## 2017-08-30 DIAGNOSIS — Z800 Family history of malignant neoplasm of digestive organs: Secondary | ICD-10-CM

## 2017-08-30 DIAGNOSIS — I959 Hypotension, unspecified: Secondary | ICD-10-CM | POA: Diagnosis not present

## 2017-08-30 DIAGNOSIS — Z87891 Personal history of nicotine dependence: Secondary | ICD-10-CM | POA: Diagnosis not present

## 2017-08-30 DIAGNOSIS — S72144A Nondisplaced intertrochanteric fracture of right femur, initial encounter for closed fracture: Secondary | ICD-10-CM

## 2017-08-30 DIAGNOSIS — Z8249 Family history of ischemic heart disease and other diseases of the circulatory system: Secondary | ICD-10-CM

## 2017-08-30 DIAGNOSIS — M25551 Pain in right hip: Secondary | ICD-10-CM | POA: Diagnosis present

## 2017-08-30 DIAGNOSIS — Z6825 Body mass index (BMI) 25.0-25.9, adult: Secondary | ICD-10-CM

## 2017-08-30 DIAGNOSIS — K219 Gastro-esophageal reflux disease without esophagitis: Secondary | ICD-10-CM | POA: Diagnosis present

## 2017-08-30 DIAGNOSIS — Z808 Family history of malignant neoplasm of other organs or systems: Secondary | ICD-10-CM

## 2017-08-30 DIAGNOSIS — Z96653 Presence of artificial knee joint, bilateral: Secondary | ICD-10-CM | POA: Diagnosis present

## 2017-08-30 DIAGNOSIS — Z86711 Personal history of pulmonary embolism: Secondary | ICD-10-CM

## 2017-08-30 DIAGNOSIS — E785 Hyperlipidemia, unspecified: Secondary | ICD-10-CM | POA: Diagnosis present

## 2017-08-30 DIAGNOSIS — S72009A Fracture of unspecified part of neck of unspecified femur, initial encounter for closed fracture: Secondary | ICD-10-CM | POA: Diagnosis present

## 2017-08-30 DIAGNOSIS — I10 Essential (primary) hypertension: Secondary | ICD-10-CM | POA: Diagnosis present

## 2017-08-30 DIAGNOSIS — Z7901 Long term (current) use of anticoagulants: Secondary | ICD-10-CM

## 2017-08-30 DIAGNOSIS — R339 Retention of urine, unspecified: Secondary | ICD-10-CM | POA: Diagnosis not present

## 2017-08-30 DIAGNOSIS — S72001A Fracture of unspecified part of neck of right femur, initial encounter for closed fracture: Secondary | ICD-10-CM

## 2017-08-30 DIAGNOSIS — Z419 Encounter for procedure for purposes other than remedying health state, unspecified: Secondary | ICD-10-CM

## 2017-08-30 DIAGNOSIS — D509 Iron deficiency anemia, unspecified: Secondary | ICD-10-CM | POA: Diagnosis present

## 2017-08-30 DIAGNOSIS — Z833 Family history of diabetes mellitus: Secondary | ICD-10-CM | POA: Diagnosis not present

## 2017-08-30 DIAGNOSIS — W010XXA Fall on same level from slipping, tripping and stumbling without subsequent striking against object, initial encounter: Secondary | ICD-10-CM | POA: Diagnosis present

## 2017-08-30 DIAGNOSIS — Z79899 Other long term (current) drug therapy: Secondary | ICD-10-CM

## 2017-08-30 DIAGNOSIS — Z86718 Personal history of other venous thrombosis and embolism: Secondary | ICD-10-CM | POA: Diagnosis not present

## 2017-08-30 DIAGNOSIS — C787 Secondary malignant neoplasm of liver and intrahepatic bile duct: Secondary | ICD-10-CM | POA: Diagnosis present

## 2017-08-30 DIAGNOSIS — Z8042 Family history of malignant neoplasm of prostate: Secondary | ICD-10-CM

## 2017-08-30 HISTORY — DX: Unspecified convulsions: R56.9

## 2017-08-30 LAB — COMPREHENSIVE METABOLIC PANEL
ALT: 45 U/L (ref 17–63)
AST: 55 U/L — AB (ref 15–41)
Albumin: 2.8 g/dL — ABNORMAL LOW (ref 3.5–5.0)
Alkaline Phosphatase: 155 U/L — ABNORMAL HIGH (ref 38–126)
Anion gap: 13 (ref 5–15)
BUN: 15 mg/dL (ref 6–20)
CHLORIDE: 105 mmol/L (ref 101–111)
CO2: 19 mmol/L — ABNORMAL LOW (ref 22–32)
Calcium: 8.7 mg/dL — ABNORMAL LOW (ref 8.9–10.3)
Creatinine, Ser: 0.94 mg/dL (ref 0.61–1.24)
GFR calc Af Amer: >60 mL/min (ref >60)
Glucose, Bld: 145 mg/dL — ABNORMAL HIGH (ref 65–99)
POTASSIUM: 4 mmol/L (ref 3.5–5.1)
SODIUM: 137 mmol/L (ref 135–145)
Total Bilirubin: 0.7 mg/dL (ref 0.3–1.2)
Total Protein: 6 g/dL — ABNORMAL LOW (ref 6.5–8.1)

## 2017-08-30 LAB — URINALYSIS, COMPLETE (UACMP) WITH MICROSCOPIC
BACTERIA UA: NONE SEEN
Bilirubin Urine: NEGATIVE
Glucose, UA: NEGATIVE mg/dL
Hgb urine dipstick: NEGATIVE
KETONES UR: NEGATIVE mg/dL
Leukocytes, UA: NEGATIVE
Nitrite: NEGATIVE
PROTEIN: 30 mg/dL — AB
SQUAMOUS EPITHELIAL / LPF: NONE SEEN (ref 0–5)
Specific Gravity, Urine: 1.02 (ref 1.005–1.030)
pH: 6 (ref 5.0–8.0)

## 2017-08-30 LAB — CBC WITH DIFFERENTIAL/PLATELET
Basophils Absolute: 0 10*3/uL (ref 0–0.1)
Basophils Relative: 1 %
EOS PCT: 1 %
Eosinophils Absolute: 0 10*3/uL (ref 0–0.7)
HCT: 38.7 % — ABNORMAL LOW (ref 40.0–52.0)
HEMOGLOBIN: 13 g/dL (ref 13.0–18.0)
LYMPHS ABS: 0.3 10*3/uL — AB (ref 1.0–3.6)
LYMPHS PCT: 8 %
MCH: 30.7 pg (ref 26.0–34.0)
MCHC: 33.5 g/dL (ref 32.0–36.0)
MCV: 91.6 fL (ref 80.0–100.0)
MONOS PCT: 10 %
Monocytes Absolute: 0.4 10*3/uL (ref 0.2–1.0)
NEUTROS PCT: 80 %
Neutro Abs: 3.2 10*3/uL (ref 1.4–6.5)
Platelets: 148 10*3/uL — ABNORMAL LOW (ref 150–440)
RBC: 4.22 MIL/uL — AB (ref 4.40–5.90)
RDW: 19.9 % — ABNORMAL HIGH (ref 11.5–14.5)
WBC: 4 10*3/uL (ref 3.8–10.6)

## 2017-08-30 LAB — MRSA PCR SCREENING: MRSA by PCR: NEGATIVE

## 2017-08-30 MED ORDER — ACETAMINOPHEN 650 MG RE SUPP: 650.0000 mg | Freq: Four times a day (QID) | RECTAL | Status: DC | PRN

## 2017-08-30 MED ORDER — ACETAMINOPHEN 325 MG PO TABS: 650.0000 mg | ORAL_TABLET | Freq: Four times a day (QID) | ORAL | Status: DC | PRN

## 2017-08-30 MED ORDER — SIMVASTATIN 20 MG PO TABS
40.0000 mg | ORAL_TABLET | Freq: Every day | ORAL | Status: DC
  Administered 2017-08-30 – 2017-09-01 (×2): 40 mg via ORAL
  Filled 2017-08-30: qty 2
  Filled 2017-08-30 (×2): qty 4
  Filled 2017-08-30: qty 2

## 2017-08-30 MED ORDER — ONDANSETRON HCL 4 MG/2ML IJ SOLN: 4.0000 mg | Freq: Four times a day (QID) | INTRAMUSCULAR | Status: DC | PRN

## 2017-08-30 MED ORDER — CEFAZOLIN SODIUM-DEXTROSE 2-4 GM/100ML-% IV SOLN
2.0000 g | INTRAVENOUS | Status: AC
  Administered 2017-08-31: 2 g via INTRAVENOUS
  Filled 2017-08-30: qty 100

## 2017-08-30 MED ORDER — OLANZAPINE 5 MG PO TBDP
5.0000 mg | ORAL_TABLET | Freq: Every day | ORAL | Status: DC
  Filled 2017-08-30 (×4): qty 1

## 2017-08-30 MED ORDER — DEXAMETHASONE 4 MG PO TABS
2.0000 mg | ORAL_TABLET | Freq: Two times a day (BID) | ORAL | Status: DC
  Administered 2017-08-30 – 2017-08-31 (×2): 2 mg via ORAL
  Filled 2017-08-30 (×3): qty 0.5

## 2017-08-30 MED ORDER — VITAMIN B-12 1000 MCG PO TABS
500.0000 ug | ORAL_TABLET | Freq: Every day | ORAL | Status: DC
  Administered 2017-09-03: 500 ug via ORAL
  Filled 2017-08-30: qty 1

## 2017-08-30 MED ORDER — PANTOPRAZOLE SODIUM 40 MG PO TBEC
40.0000 mg | DELAYED_RELEASE_TABLET | Freq: Two times a day (BID) | ORAL | Status: DC
  Administered 2017-08-30 – 2017-09-03 (×8): 40 mg via ORAL
  Filled 2017-08-30 (×8): qty 1

## 2017-08-30 MED ORDER — ONDANSETRON HCL 4 MG/2ML IJ SOLN
4.0000 mg | Freq: Once | INTRAMUSCULAR | Status: AC
  Administered 2017-08-30: 4 mg via INTRAVENOUS
  Filled 2017-08-30: qty 2

## 2017-08-30 MED ORDER — ONDANSETRON HCL 4 MG PO TABS
4.0000 mg | ORAL_TABLET | Freq: Four times a day (QID) | ORAL | Status: DC | PRN
  Administered 2017-08-30: 4 mg via ORAL
  Filled 2017-08-30: qty 1

## 2017-08-30 MED ORDER — FIRST-MOUTHWASH BLM MT SUSP
5.0000 mL | Freq: Four times a day (QID) | OROMUCOSAL | Status: DC
Start: 2017-08-30 — End: 2017-08-31
  Administered 2017-08-30 – 2017-08-31 (×2): 5 mL via ORAL
  Filled 2017-08-30: qty 236

## 2017-08-30 MED ORDER — MORPHINE SULFATE (PF) 4 MG/ML IV SOLN
4.0000 mg | INTRAVENOUS | Status: DC | PRN
  Administered 2017-08-30: 4 mg via INTRAVENOUS
  Filled 2017-08-30: qty 1

## 2017-08-30 MED ORDER — SODIUM CHLORIDE 0.9 % IV SOLN
Freq: Once | INTRAVENOUS | Status: AC
  Administered 2017-08-30: 15:00:00 via INTRAVENOUS

## 2017-08-30 MED ORDER — OXYCODONE HCL 5 MG PO TABS
5.0000 mg | ORAL_TABLET | ORAL | Status: DC | PRN
  Administered 2017-08-30 – 2017-08-31 (×4): 5 mg via ORAL
  Filled 2017-08-30 (×4): qty 1

## 2017-08-30 MED ORDER — FERROUS SULFATE 325 (65 FE) MG PO TABS
325.0000 mg | ORAL_TABLET | Freq: Every day | ORAL | Status: DC
  Administered 2017-09-01 – 2017-09-02 (×2): 325 mg via ORAL
  Filled 2017-08-30 (×3): qty 1

## 2017-08-30 MED ORDER — LOSARTAN POTASSIUM 25 MG PO TABS: 25.0000 mg | ORAL_TABLET | Freq: Every day | ORAL | Status: DC

## 2017-08-30 MED ORDER — ADULT MULTIVITAMIN W/MINERALS CH
1.0000 | ORAL_TABLET | Freq: Every day | ORAL | Status: DC
  Administered 2017-09-03: 1 via ORAL
  Filled 2017-08-30: qty 1

## 2017-08-30 MED ORDER — MORPHINE SULFATE (PF) 4 MG/ML IV SOLN
4.0000 mg | INTRAVENOUS | Status: DC | PRN
  Administered 2017-08-30 (×2): 4 mg via INTRAVENOUS
  Filled 2017-08-30 (×2): qty 1

## 2017-08-30 MED ORDER — GABAPENTIN 300 MG PO CAPS
300.0000 mg | ORAL_CAPSULE | Freq: Every day | ORAL | Status: DC
  Administered 2017-08-30 – 2017-09-03 (×4): 300 mg via ORAL
  Filled 2017-08-30 (×4): qty 1

## 2017-08-30 MED ORDER — BACLOFEN 10 MG PO TABS
10.0000 mg | ORAL_TABLET | Freq: Four times a day (QID) | ORAL | Status: DC
  Filled 2017-08-30 (×6): qty 1

## 2017-08-30 NOTE — Progress Notes (Signed)
Pt admitted to room 151 from the ED. Pt is A&Ox4, rates pain in right hip 8/10. After IV morphine given, pt's pain reduced to 3/10. Pt educated on pain regimen and plan of care. Family is at bedside. VSS.  Jason Cooper, Jerry Caras

## 2017-08-30 NOTE — H&P (Signed)
[facility] [place] NAME: Jason Cooper    MR#:  [id number]  DATE OF BIRTH:  March 19, 1946  DATE OF ADMISSION:  08/30/2017  PRIMARY CARE PHYSICIAN: Ria Bush, MD   REQUESTING/REFERRING PHYSICIAN: Dr Merlyn Lot  CHIEF COMPLAINT:   Chief Complaint  Patient presents with  . Hip Pain    HISTORY OF PRESENT ILLNESS:  Jason Cooper  is a 72 y.o. male with a known history recurrent metastatic esophageal cancer.  He was unloading his truck today he lost his balance and fell on his right hip.  It was numb and 6 out of 10 pain in intensity.  He was given some fentanyl by EMS.  Now the pain is down to 4 out of 10 intensity.  The patient was found to have a right Hip fracture.  Of note, the patient takes Xarelto for DVT and his last dose was this morning at 7 AM.  No complaints of chest pain or shortness of breath.  Hospitalist services were contacted for further evaluation.  PAST MEDICAL HISTORY:   Past Medical History:  Diagnosis Date  . Acute pulmonary embolism ([place]) 11/28/2015   Incidentally found on CT 11/2015, started lovenox  . Cluster headache    as 72 yo, no more since retired (25 yrs ago)  . Coronary artery disease   . DVT (deep venous thrombosis) ([place])    2018  . Environmental allergies   . H/O malaria 1969   during [place] (hospitalized in [place])  . H/O varicella   . History of chicken pox   . HLD (hyperlipidemia)   . HTN (hypertension)   . Malignant neoplasm of lower third of esophagus ([place]) 09/15/2015   Followed at [place] by rad onc and onc   . Osteoarthritis    s/p bilat knee replacements  . Prediabetes 2007  . Seasonal allergic rhinitis   . Seizures ([place])     PAST SURGICAL HISTORY:   Past Surgical History:  Procedure Laterality Date  . ANKLE SURGERY Left 11-1999   haglin deformity [name])  . COLONOSCOPY  2008   WNL, rec rpt 10 yrs [name])  . ESOPHAGOGASTRODUODENOSCOPY  06/2017   partially  obstructing malignant tumor lower third esophagus, gastric tumor in cardia ([facility])  . ESOPHAGOGASTRODUODENOSCOPY (EGD) WITH PROPOFOL N/A 09/09/2015   Procedure: ESOPHAGOGASTRODUODENOSCOPY (EGD) WITH PROPOFOL;  Surgeon: Manya Silvas, MD;  Location: [facility] ENDOSCOPY;  Service: Endoscopy;  Laterality: N/A;  . JOINT REPLACEMENT Right 02/2002  . PORTACATH PLACEMENT  09/2015  . TOTAL KNEE ARTHROPLASTY Bilateral 2003, 2014   right-2003; left-2014    SOCIAL HISTORY:   Social History   Tobacco Use  . Smoking status: Former Smoker    Types: Cigarettes    Last attempt to quit: 06/02/1997    Years since quitting: 20.2  . Smokeless tobacco: Never Used  Substance Use Topics  . Alcohol use: Yes    Alcohol/week: 0.0 oz    FAMILY HISTORY:   Family History  Problem Relation Age of Onset  . Hypertension Mother   . Stroke Mother   . Thyroid disease Mother        needed it removed, unsure why  . Stroke Other        maternal and paternal grandparents  . Hypertension Father   . Diabetes Father   . Stroke Father   . Cancer Paternal Aunt        bone  . Cancer Paternal Aunt  pancreas  . Cancer Cousin        prostate  . Cancer Sister   . CAD Neg Hx     DRUG ALLERGIES:  No Known Allergies  REVIEW OF SYSTEMS:  CONSTITUTIONAL: No fever.  Positive for cold feeling.  Positive for fatigue.  Positive for weight loss with the diagnosis of cancer in 2017 EYES: No blurred or double vision.  EARS, NOSE, AND THROAT: No tinnitus or ear pain. No sore throat.  Positive for runny nose. RESPIRATORY: No cough, shortness of breath, wheezing or hemoptysis.  CARDIOVASCULAR: No chest pain, orthopnea, edema.  GASTROINTESTINAL: Did have nausea, vomiting with radiation therapy.  No diarrhea or abdominal pain. No blood in bowel movements.  History of constipation  GENITOURINARY: No dysuria, hematuria.  ENDOCRINE: No polyuria, nocturia,  HEMATOLOGY: No anemia, easy bruising or bleeding SKIN: No rash  or lesion. MUSCULOSKELETAL: Positive right hip pain NEUROLOGIC: No tingling, numbness, weakness.  PSYCHIATRY: No anxiety or depression.   MEDICATIONS AT HOME:   Prior to Admission medications   Medication Sig Start Date End Date Taking? Authorizing Provider  baclofen (LIORESAL) 10 MG tablet Take 1 tablet by mouth 4 (four) times daily. 08/12/17  Yes [provider]  Cyanocobalamin (B-12 PO) Take 500 mcg by mouth daily.   Yes [provider]  dexamethasone (DECADRON) 2 MG tablet Take 1 tablet by mouth 2 (two) times daily. 08/07/17  Yes [provider]  DPH-Lido-AlHydr-MgHydr-Simeth (FIRST-MOUTHWASH BLM) SUSP Take 5 mLs by mouth 4 (four) times daily. 08/25/17  Yes [provider]  Ferrous Sulfate (SLOW RELEASE IRON PO) Take 45 mg by mouth daily.    Yes [provider]  gabapentin (NEURONTIN) 300 MG capsule Take 1 capsule by mouth daily. 08/01/17  Yes [provider]  losartan (COZAAR) 25 MG tablet Take 1 tablet by mouth daily. 08/07/17  Yes [provider]  Multiple Vitamin (MULTIVITAMIN) capsule Take 1 capsule by mouth daily.   Yes [provider]  OLANZapine zydis (ZYPREXA) 5 MG disintegrating tablet Take 1 tablet by mouth daily. 08/10/17  Yes [provider]  PACLitaxel (TAXOL IV) Inject into the vein every 21 ( twenty-one) days.   Yes [provider]  pantoprazole (PROTONIX) 40 MG tablet Take 40 mg by mouth 2 (two) times daily before a meal.    Yes [provider]  ramucirumab in sodium chloride 0.9 % Inject into the vein every 21 ( twenty-one) days.   Yes [provider]  rivaroxaban (XARELTO) 20 MG TABS tablet Take 20 mg by mouth daily.   Yes [provider]  simvastatin (ZOCOR) 40 MG tablet Take 40 mg by mouth at bedtime.    Yes [provider]  ondansetron (ZOFRAN) 8 MG tablet Take 8 mg by mouth every 8 (eight) hours as needed for nausea or vomiting.    [provider]      VITAL SIGNS:  Blood pressure (!) 146/80, pulse 73, resp. rate 18, height 6\' 3"  (1.905 m), weight 98 kg (216 lb), SpO2 100 %.  PHYSICAL EXAMINATION:  GENERAL:  72 y.o.-year-old patient lying in the bed with no acute distress.  EYES: Pupils equal, round, reactive to light and accommodation. No scleral icterus. Extraocular muscles intact.  HEENT: Head atraumatic, normocephalic. Oropharynx and nasopharynx clear.  NECK:  Supple, no jugular venous distention. No thyroid enlargement, no tenderness.  LUNGS: Normal breath sounds bilaterally, no wheezing, rales,rhonchi or crepitation. No use of accessory muscles of respiration.  CARDIOVASCULAR: S1, S2 normal.  No murmurs, rubs, or gallops.  ABDOMEN: Soft, nontender, nondistended. Bowel sounds present. No organomegaly or mass.  EXTREMITIES: Trace pedal edema, no cyanosis, or clubbing.  Right leg shortened and externally rotated NEUROLOGIC: Cranial nerves II through XII are intact. Sensation intact. Gait not checked.  PSYCHIATRIC: The patient is alert and oriented x 3.  SKIN: No rash, lesion, or ulcer.   LABORATORY PANEL:   CBC Recent Labs  Lab 08/30/17 1319  WBC 4.0  HGB 13.0  HCT 38.7*  PLT 148*   ------------------------------------------------------------------------------------------------------------------  Chemistries  Recent Labs  Lab 08/30/17 1319  NA 137  K 4.0  CL 105  CO2 19*  GLUCOSE 145*  BUN 15  CREATININE 0.94  CALCIUM 8.7*  AST 55*  ALT 45  ALKPHOS 155*  BILITOT 0.7   ------------------------------------------------------------------------------------------------------------------    RADIOLOGY:  Dg Chest 1 View  Result Date: 08/30/2017 CLINICAL DATA:  Status post fall. EXAM: CHEST  1 VIEW COMPARISON:  09/15/2015 FINDINGS: Right-sided Port-A-Cath with the tip projecting over the cavoatrial junction. There is no focal parenchymal opacity. There is no pleural effusion or pneumothorax. The  heart and mediastinal contours are unremarkable. The osseous structures are unremarkable. IMPRESSION: No active disease. Electronically Signed   By: Kathreen Devoid   On: 08/30/2017 14:16   Dg Hip Unilat  With Pelvis 2-3 Views Right  Result Date: 08/30/2017 CLINICAL DATA:  Right hip pain after fall today. EXAM: DG HIP (WITH OR WITHOUT PELVIS) 2-3V RIGHT COMPARISON:  None. FINDINGS: Severely comminuted and displaced fracture is seen involving the intertrochanteric region of the proximal right femur which extends into proximal femoral shaft. Femoral head remains situated within the acetabulum. IMPRESSION: Severely comminuted and displaced intertrochanteric fracture of proximal right femur which extends into proximal femoral shaft. Electronically Signed   By: Marijo Conception, M.D.   On: 08/30/2017 14:16    EKG:   Normal sinus rhythm 67 bpm, intraventricular conduction delay, left axis deviation, flattening of T waves laterally  IMPRESSION AND PLAN:   1.  Preop consultation for right hip fracture.  Last dose of Xarelto was this morning at 7 AM.  Hold Xarelto until surgery.  Once Xarelto is out of his system I do not see any contraindications to surgery at this time. 2.  Metastatic esophageal cancer to the liver.  CODE STATUS discussed and patient would like to be a full code. 3.  Essential hypertension continue losartan 4.  Hyperlipidemia unspecified on Zocor 5.  GERD on PPI 6.  History of DVT.  Xarelto on hold for right hip surgery.  Hopefully can resume after procedure. 7.  One seizure in the past but no medications  All the records are reviewed and case discussed with ED provider. Management plans discussed with the patient, family and they are in agreement.  CODE STATUS:   Full code   TOTAL TIME TAKING CARE OF THIS PATIENT: 50 minutes.    Loletha Grayer M.D on 08/30/2017 at 4:22 PM  Between 7am to 6pm - Pager - [phone]  After 6pm call admission pager [phone]  [facility]  [phone]  CC: Primary care physician; Ria Bush, MD

## 2017-08-30 NOTE — Consult Note (Signed)
ORTHOPAEDIC CONSULTATION  PATIENT NAME: Jason Cooper DOB: [birth year]  MRN: [id number]  REQUESTING PHYSICIAN: Loletha Grayer, MD  Chief Complaint: Right hip pain  HPI: Jason Cooper is a 72 y.o. male who complains of severe right hip pain.  He was apparently unloading his truck earlier today when he fell, landing on his right hip and side.  He was unable to stand or bear weight due to the right hip pain.  He denied any loss of consciousness.  He denied any other injuries.  He was evaluated at the Emergency Department at [facility] and was noted to have a comminuted right pertrochanteric femur fracture.  Of note, the patient has esophageal adenocarcinoma with metastatic disease.  He completed a course of radiation therapy in April and has remained on a chemotherapy regimen as per [facility].  Past Medical History:  Diagnosis Date  . Acute pulmonary embolism ([place]) 11/28/2015   Incidentally found on CT 11/2015, started lovenox  . Cluster headache    as 72 yo, no more since retired (25 yrs ago)  . Coronary artery disease   . DVT (deep venous thrombosis) ([place])    2018  . Environmental allergies   . H/O malaria 1969   during [place] (hospitalized in [place])  . H/O varicella   . History of chicken pox   . HLD (hyperlipidemia)   . HTN (hypertension)   . Malignant neoplasm of lower third of esophagus ([place]) 09/15/2015   Followed at [place] by rad onc and onc   . Osteoarthritis    s/p bilat knee replacements  . Prediabetes 2007  . Seasonal allergic rhinitis   . Seizures ([place])    Past Surgical History:  Procedure Laterality Date  . ANKLE SURGERY Left 11-1999   haglin deformity [name])  . COLONOSCOPY  2008   WNL, rec rpt 10 yrs [name])  . 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 ESOPHAGOGASTRODUODENOSCOPY (EGD) WITH PROPOFOL N/A 09/09/2015   Procedure:  ESOPHAGOGASTRODUODENOSCOPY (EGD) WITH PROPOFOL;  Surgeon: Manya Silvas, MD;  Location: [facility] ENDOSCOPY;  Service: Endoscopy;  Laterality: N/A;  . JOINT REPLACEMENT Right 02/2002  . PORTACATH PLACEMENT  09/2015  . TOTAL KNEE ARTHROPLASTY Bilateral 2003, 2014   right-2003; left-2014   Social History   Socioeconomic History  . Marital status: Married    Spouse name: Not on file  . Number of children: Not on file  . Years of education: Not on file  . Highest education level: Not on file  Occupational History  . Not on file  Social Needs  . Financial resource strain: Not on file  . Food insecurity:    Worry: Not on file    Inability: Not on file  . Transportation needs:    Medical: Not on file    Non-medical: Not on file  Tobacco Use  . Smoking status: Former Smoker    Types: Cigarettes    Last attempt to quit: 06/02/1997    Years since quitting: 20.2  . Smokeless tobacco: Never Used  Substance and Sexual Activity  . Alcohol use: Yes    Alcohol/week: 0.0 oz  . Drug use: No  . Sexual activity: Yes  Lifestyle  . Physical activity:    Days per week: Not on file    Minutes per session: Not on file  . Stress: Not on file  Relationships  . Social connections:    Talks on phone: Not on  file    Gets together: Not on file    Attends religious service: Not on file    Active member of club or organization: Not on file    Attends meetings of clubs or organizations: Not on file    Relationship status: Not on file  Other Topics Concern  . Not on file  Social History Narrative   Lives with wife, no pets   Occupation: retired Engineer, structural   Edu: graduate degree   Activity: enjoys traveling, rides stationary bike every morning 30 min, walking 2 mi/day, goes to [employer]   Diet: good water, fruits/vegetables daily   Family History  Problem Relation Age of Onset  . Hypertension Mother   . Stroke Mother   . Thyroid disease Mother        needed it removed, unsure why  . Stroke Other         maternal and paternal grandparents  . Hypertension Father   . Diabetes Father   . Stroke Father   . Cancer Paternal Aunt        bone  . Cancer Paternal Aunt        pancreas  . Cancer Cousin        prostate  . Cancer Sister   . CAD Neg Hx    No Known Allergies Prior to Admission medications   Medication Sig Start Date End Date Taking? Authorizing Provider  baclofen (LIORESAL) 10 MG tablet Take 1 tablet by mouth 4 (four) times daily. 08/12/17  Yes [provider]  Cyanocobalamin (B-12 PO) Take 500 mcg by mouth daily.   Yes [provider]  dexamethasone (DECADRON) 2 MG tablet Take 1 tablet by mouth 2 (two) times daily. 08/07/17  Yes [provider]  DPH-Lido-AlHydr-MgHydr-Simeth (FIRST-MOUTHWASH BLM) SUSP Take 5 mLs by mouth 4 (four) times daily. 08/25/17  Yes [provider]  Ferrous Sulfate (SLOW RELEASE IRON PO) Take 45 mg by mouth daily.    Yes [provider]  gabapentin (NEURONTIN) 300 MG capsule Take 1 capsule by mouth daily. 08/01/17  Yes [provider]  losartan (COZAAR) 25 MG tablet Take 1 tablet by mouth daily. 08/07/17  Yes [provider]  Multiple Vitamin (MULTIVITAMIN) capsule Take 1 capsule by mouth daily.   Yes [provider]  OLANZapine zydis (ZYPREXA) 5 MG disintegrating tablet Take 1 tablet by mouth daily. 08/10/17  Yes [provider]  PACLitaxel (TAXOL IV) Inject into the vein every 21 ( twenty-one) days.   Yes [provider]  pantoprazole (PROTONIX) 40 MG tablet Take 40 mg by mouth 2 (two) times daily before a meal.    Yes [provider]  ramucirumab in sodium chloride 0.9 % Inject into the vein every 21 ( twenty-one) days.   Yes [provider]  rivaroxaban (XARELTO) 20 MG TABS tablet Take 20 mg by mouth daily.   Yes [provider]  simvastatin (ZOCOR) 40 MG tablet Take 40 mg by mouth at bedtime.    Yes [provider]  ondansetron  (ZOFRAN) 8 MG tablet Take 8 mg by mouth every 8 (eight) hours as needed for nausea or vomiting.    [provider]   Dg Chest 1 View  Result Date: 08/30/2017 CLINICAL DATA:  Status post fall. EXAM: CHEST  1 VIEW COMPARISON:  09/15/2015 FINDINGS: Right-sided Port-A-Cath with the tip projecting over the cavoatrial junction. There is no focal parenchymal opacity. There is no pleural effusion or pneumothorax. The heart and mediastinal contours  are unremarkable. The osseous structures are unremarkable. IMPRESSION: No active disease. Electronically Signed   By: Kathreen Devoid   On: 08/30/2017 14:16   Dg Hip Unilat  With Pelvis 2-3 Views Right  Result Date: 08/30/2017 CLINICAL DATA:  Right hip pain after fall today. EXAM: DG HIP (WITH OR WITHOUT PELVIS) 2-3V RIGHT COMPARISON:  None. FINDINGS: Severely comminuted and displaced fracture is seen involving the intertrochanteric region of the proximal right femur which extends into proximal femoral shaft. Femoral head remains situated within the acetabulum. IMPRESSION: Severely comminuted and displaced intertrochanteric fracture of proximal right femur which extends into proximal femoral shaft. Electronically Signed   By: Marijo Conception, M.D.   On: 08/30/2017 14:16    Positive ROS: All other systems have been reviewed and were otherwise negative with the exception of those mentioned in the HPI and as above.  Physical Exam: General: Well developed, well nourished male seen in no apparent discomfort. HEENT: Atraumatic and normocephalic. Sclera are clear. Extraocular motion is intact. Oropharynx is clear with moist mucosa. Neck: Supple, nontender, good range of motion. No JVD or carotid bruits. Lungs: Clear to auscultation bilaterally. Cardiovascular: Regular rate and rhythm with normal S1 and S2. No murmurs. No gallops or rubs. Pedal pulses are palpable bilaterally. Homans test is negative bilaterally.  Mild bilateral pretibial and ankle edema. Abdomen:  Soft, nontender, and nondistended. Bowel sounds are present. Skin: No lesions in the area of chief complaint Neurologic: Awake, alert, and oriented. Sensory function is grossly intact. Motor strength is felt to be 5 over 5 bilaterally. No clonus or tremor. Good motor coordination. Lymphatic: No axillary or cervical lymphadenopathy  MUSCULOSKELETAL: Evaluation of the right lower extremity shows the leg to be shortened and rotated.  Pain is elicited with any attempted range of motion of the right hip.  No tenderness to palpation about the knee or ankle.  No knee effusion.  Assessment: Comminuted right pertrochanteric femur fracture  Plan: The findings were discussed in detail with the patient and his family.  Recommendation was made for open reduction and internal fixation of the right pertrochanteric femur fracture.  The usual perioperative course was discussed. The risks and benefits of surgical intervention were reviewed. The patient expressed understanding of the risks and benefits and agreed with plans for surgical intervention.  I discussed the patient's status with Dr. Inge Rise, Anesthesiology.  He will further review the patient's studies, but would anticipate general anesthesia.  I will also try to coordinate his perioperative care with his oncologist.  Laurice Record. Holley Bouche M.D.

## 2017-08-30 NOTE — ED Notes (Signed)
Pt in xray

## 2017-08-30 NOTE — ED Notes (Signed)
Pt states going through chemo at this time, next appt on Monday.

## 2017-08-30 NOTE — Progress Notes (Signed)
Patient ID: Jason Cooper, male   DOB: [birth year], 72 y.o.   MRN: [id number]  ACP note  Patient and son at the bedside  Diagnosis right hip fracture, metastatic esophageal cancer to liver.  Goal of any metastatic cancer is to slow things down with palliative chemotherapy and treatments.  CODE STATUS discussed. Patient would like to be a full code at this time.  Time spent on ACP discussion 17 minutes Dr. Loletha Grayer

## 2017-08-30 NOTE — ED Triage Notes (Signed)
Pt arrives [facility] for R hip pain. Pt fell backwards earlier today. Denies being dizzy beforehand. States he lost his balance. Denies hitting head or LOC. Outward rotation and shortening. R leg is warm with pulses present. Alert and oriented upon arrival. EMS gave 67mcg fentanyl at 1252 and a second dose of 50 mcg fentanyl at 1309.  Pt sees [facility] for esophageal and liver CA.

## 2017-08-30 NOTE — ED Provider Notes (Signed)
[facility] Emergency Department Provider Note    First MD Initiated Contact with Patient 08/30/17 1329     (approximate)  I have reviewed the triage vital signs and the nursing notes.   HISTORY  Chief Complaint Hip Pain    HPI KRYSTLE OBERMAN is a 72 y.o. male history of esophageal and liver cancer on chemotherapy at [place] presents to the ER with chief complaint of acute right hip pain that occurred after the patient was unloading a car stepped backwards and fell onto his right hip.  He is unable to ambulate due to severe right hip pain.  EMS found the patient with shortened and externally rotated right leg.  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  Denies any head injury.  No neck pain.  Past Medical History:  Diagnosis Date  . Acute pulmonary embolism ([place]) 11/28/2015   Incidentally found on CT 11/2015, started lovenox  . Cluster headache    as 72 yo, no more since retired (25 yrs ago)  . Coronary artery disease   . DVT (deep venous thrombosis) ([place])    2018  . Environmental allergies   . H/O malaria 1969   during [place] (hospitalized in [place])  . H/O varicella   . History of chicken pox   . HLD (hyperlipidemia)   . HTN (hypertension)   . Malignant neoplasm of lower third of esophagus ([place]) 09/15/2015   Followed at [facility] by rad onc and onc   . Osteoarthritis    s/p bilat knee replacements  . Prediabetes 2007  . Seasonal allergic rhinitis    Family History  Problem Relation Age of Onset  . Hypertension Mother   . Stroke Mother   . Thyroid disease Mother        needed it removed, unsure why  . Stroke Other        maternal and paternal grandparents  . Hypertension Father   . Diabetes Father   . Cancer Paternal Aunt        bone  . Cancer Paternal Aunt        pancreas  . Cancer Cousin        prostate  . Cancer Sister   . CAD Neg Hx    Past Surgical History:  Procedure Laterality Date  . ANKLE SURGERY Left 11-1999   [place]  deformity [name])  . COLONOSCOPY  2008   WNL, rec rpt 10 yrs [name])  . 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 ESOPHAGOGASTRODUODENOSCOPY (EGD) WITH PROPOFOL N/A 09/09/2015   Procedure: ESOPHAGOGASTRODUODENOSCOPY (EGD) WITH PROPOFOL;  Surgeon: Manya Silvas, MD;  [facility] END[facility]COPY;  Service: Endoscopy;  Laterality: N/A;  . JOINT REPLACEMENT Right 02/2002  . PORTACATH PLACEMENT  09/2015  . TOTAL KNEE ARTHROPLASTY Bilateral 2003, 2014   right-2003; left-2014   Patient Active Problem List   Diagnosis Date Noted  . Cough 04/21/2017  . Subacute sinusitis 04/21/2017  . Elevated PSA 01/22/2017  . 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 Acute pulmonary embolism ([place]) 11/28/2015  . Malignant neoplasm of lower third of esophagus ([place]) 09/15/2015  . Syncopal episodes 09/15/2015  . Subclinical hyperthyroidism 07/15/2015  . Advanced care planning/counseling discussion 07/11/2014  . Medicare annual wellness visit, subsequent 07/09/2013  . HTN (hypertension)   . HLD (hyperlipidemia)   . Controlled type 2 diabetes mellitus without complication, without long-term current use of insulin ([place])   .  Seasonal allergic rhinitis   . Osteoarthritis       Prior to Admission medications   Medication Sig Start Date End Date Taking? Authorizing Provider  Cyanocobalamin (B-12 PO) Take 500 mcg by mouth daily.    [provider]  Ferrous Sulfate (SLOW RELEASE IRON PO) Take 45 mg by mouth daily.     [provider]  guaiFENesin-codeine (CHERATUSSIN AC) 100-10 MG/5ML syrup Take 5 mLs by mouth 2 (two) times daily as needed for cough (sedation precautions). 04/21/17   Ria Bush, MD  Multiple Vitamin (MULTIVITAMIN) capsule Take 1 capsule by mouth daily.    [provider]  ondansetron (ZOFRAN) 8 MG tablet Take 8 mg by mouth every 8 (eight) hours as needed  for nausea or vomiting.    [provider]  PACLitaxel (TAXOL IV) Inject into the vein every 21 ( twenty-one) days.    [provider]  pantoprazole (PROTONIX) 40 MG tablet Take 40 mg by mouth 2 (two) times daily before a meal.     [provider]  ramucirumab in sodium chloride 0.9 % Inject into the vein every 21 ( twenty-one) days.    [provider]  rivaroxaban (XARELTO) 20 MG TABS tablet Take 20 mg by mouth daily.    [provider]  simvastatin (ZOCOR) 40 MG tablet Take 40 mg by mouth at bedtime.     [provider]    Allergies Patient has no known allergies.    Social History Social History   Tobacco Use  . Smoking status: Former Smoker    Types: Cigarettes    Last attempt to quit: 06/02/1997    Years since quitting: 20.2  . Smokeless tobacco: Never Used  Substance Use Topics  . Alcohol use: Yes    Alcohol/week: 0.0 oz  . Drug use: No    Review of Systems Patient denies headaches, rhinorrhea, blurry vision, numbness, shortness of breath, chest pain, edema, cough, abdominal pain, nausea, vomiting, diarrhea, dysuria, fevers, rashes or hallucinations unless otherwise stated above in HPI. ____________________________________________   PHYSICAL EXAM:  VITAL SIGNS: Vitals:   08/30/17 1345 08/30/17 1430  Pulse: 85 75  Resp: (!) 21 (!) 25  SpO2: 94% 98%    Constitutional: Alert and oriented. Well appearing and in no acute distress. Eyes: Conjunctivae are normal.  Head: Atraumatic. Nose: No congestion/rhinnorhea. Mouth/Throat: Mucous membranes are moist.   Neck: No stridor. Painless ROM.  Cardiovascular: Normal rate, regular rhythm. Grossly normal heart sounds.  Good peripheral circulation. Respiratory: Normal respiratory effort.  No retractions. Lungs CTAB. Gastrointestinal: Soft and nontender. No distention. No abdominal bruits. No CVA tenderness. Genitourinary: deferred Musculoskeletal: No lower extremity  tenderness.  Right leg shortened and externally rotated,  SILT distally, strong DP and PT pulses. Pain with right log roll.  No joint effusions. Neurologic:  Normal speech and language. No gross focal neurologic deficits are appreciated. No facial droop Skin:  Skin is warm, dry and intact. No rash noted. Psychiatric: Mood and affect are normal. Speech and behavior are normal.  ____________________________________________   LABS (all labs ordered are listed, but only abnormal results are displayed)  Results for orders placed or performed during the hospital encounter of 08/30/17 (from the past 24 hour(s))  Comprehensive metabolic panel     Status: Abnormal   Collection Time: 08/30/17  1:19 PM  Result Value Ref Range   Sodium 137 135 - 145 mmol/L   Potassium 4.0 3.5 - 5.1 mmol/L   Chloride 105 101 - 111  mmol/L   CO2 19 (L) 22 - 32 mmol/L   Glucose, Bld 145 (H) 65 - 99 mg/dL   BUN 15 6 - 20 mg/dL   Creatinine, Ser 0.94 0.61 - 1.24 mg/dL   Calcium 8.7 (L) 8.9 - 10.3 mg/dL   Total Protein 6.0 (L) 6.5 - 8.1 g/dL   Albumin 2.8 (L) 3.5 - 5.0 g/dL   AST 55 (H) 15 - 41 U/L   ALT 45 17 - 63 U/L   Alkaline Phosphatase 155 (H) 38 - 126 U/L   Total Bilirubin 0.7 0.3 - 1.2 mg/dL   GFR calc non Af Amer >60 >60 mL/min   GFR calc Af Amer >60 >60 mL/min   Anion gap 13 5 - 15  CBC with Differential     Status: Abnormal   Collection Time: 08/30/17  1:19 PM  Result Value Ref Range   WBC 4.0 3.8 - 10.6 K/uL   RBC 4.22 (L) 4.40 - 5.90 MIL/uL   Hemoglobin 13.0 13.0 - 18.0 g/dL   HCT 38.7 (L) 40.0 - 52.0 %   MCV 91.6 80.0 - 100.0 fL   MCH 30.7 26.0 - 34.0 pg   MCHC 33.5 32.0 - 36.0 g/dL   RDW 19.9 (H) 11.5 - 14.5 %   Platelets 148 (L) 150 - 440 K/uL   Neutrophils Relative % 80 %   Neutro Abs 3.2 1.4 - 6.5 K/uL   Lymphocytes Relative 8 %   Lymphs Abs 0.3 (L) 1.0 - 3.6 K/uL   Monocytes Relative 10 %   Monocytes Absolute 0.4 0.2 - 1.0 K/uL   Eosinophils Relative 1 %   Eosinophils Absolute 0.0 0  - 0.7 K/uL   Basophils Relative 1 %   Basophils Absolute 0.0 0 - 0.1 K/uL   ____________________________________________  EKG My review and personal interpretation at Time: 14:46   Indication: chest pain  Rate: 70  Rhythm: sinus Axis: normal Other: normal intervals, no stemi ____________________________________________  RADIOLOGY  I personally reviewed all radiographic images ordered to evaluate for the above acute complaints and reviewed radiology reports and findings.  These findings were personally discussed with the patient.  Please see medical record for radiology report.  ____________________________________________   PROCEDURES  Procedure(s) performed:  Procedures    Critical Care performed: no ____________________________________________   INITIAL IMPRESSION / ASSESSMENT AND PLAN / ED COURSE  Pertinent labs & imaging results that were available during my care of the patient were reviewed by me and considered in my medical decision making (see chart for details).  DDX: fracture, dislocation, contusion  NICKOLAUS BORDELON is a 72 y.o. who presents to the ED with chief complaint of right hip pain as described above.  Patient does have evidence of right intertrochanteric fracture with displacement.  Patient is on Xarelto.  No head injury.  She given IV fluids as well as IV pain medication.  He will require hospitalization for operative fixation.  Have discussed with the patient and available family all diagnostics and treatments performed thus far and all questions were answered to the best of my ability. The patient demonstrates understanding and agreement with plan.       As part of my medical decision making, I reviewed the following data within the [facility] notes reviewed and incorporated, Labs reviewed, notes from prior ED visits.   ____________________________________________   FINAL CLINICAL IMPRESSION(S) / ED DIAGNOSES  Final  diagnoses:  Right hip pain  Closed nondisplaced intertrochanteric fracture of right  femur, initial encounter ([place])      NEW MEDICATIONS STARTED DURING THIS VISIT:  New Prescriptions   No medications on file     Note:  This document was prepared using Dragon voice recognition software and may include unintentional dictation errors.    Merlyn Lot, MD 08/30/17 [phone]

## 2017-08-31 ENCOUNTER — Encounter
Admission: EM | Disposition: A | Discharge status: Skilled Nursing Facility | Payer: Self-pay | Source: Home / Self Care | Attending: Family Medicine

## 2017-08-31 ENCOUNTER — Encounter: Payer: Self-pay | Admitting: Anesthesiology

## 2017-08-31 ENCOUNTER — Inpatient Hospital Stay: Payer: Medicare Other

## 2017-08-31 ENCOUNTER — Inpatient Hospital Stay: Payer: Medicare Other | Admitting: Anesthesiology

## 2017-08-31 ENCOUNTER — Encounter: Payer: Medicare Other | Admitting: Family Medicine

## 2017-08-31 DIAGNOSIS — E440 Moderate protein-calorie malnutrition: Secondary | ICD-10-CM

## 2017-08-31 HISTORY — PX: INTRAMEDULLARY (IM) NAIL INTERTROCHANTERIC: SHX5875

## 2017-08-31 LAB — BASIC METABOLIC PANEL
Anion gap: 6 (ref 5–15)
BUN: 17 mg/dL (ref 6–20)
CHLORIDE: 107 mmol/L (ref 101–111)
CO2: 24 mmol/L (ref 22–32)
Calcium: 7.9 mg/dL — ABNORMAL LOW (ref 8.9–10.3)
Creatinine, Ser: 0.71 mg/dL (ref 0.61–1.24)
GFR calc Af Amer: >60 mL/min (ref >60)
GFR calc non Af Amer: >60 mL/min (ref >60)
Glucose, Bld: 119 mg/dL — ABNORMAL HIGH (ref 65–99)
POTASSIUM: 4 mmol/L (ref 3.5–5.1)
Sodium: 137 mmol/L (ref 135–145)

## 2017-08-31 LAB — CBC
HEMATOCRIT: 33.2 % — AB (ref 40.0–52.0)
Hemoglobin: 11 g/dL — ABNORMAL LOW (ref 13.0–18.0)
MCH: 30.5 pg (ref 26.0–34.0)
MCHC: 33.2 g/dL (ref 32.0–36.0)
MCV: 92 fL (ref 80.0–100.0)
Platelets: 119 10*3/uL — ABNORMAL LOW (ref 150–440)
RBC: 3.61 MIL/uL — ABNORMAL LOW (ref 4.40–5.90)
RDW: 19.6 % — AB (ref 11.5–14.5)
WBC: 3.8 10*3/uL (ref 3.8–10.6)

## 2017-08-31 SURGERY — FIXATION, FRACTURE, INTERTROCHANTERIC, WITH INTRAMEDULLARY ROD
Anesthesia: General | Site: Hip | Laterality: Right | Wound class: Clean

## 2017-08-31 MED ORDER — PHENOL 1.4 % MT LIQD
1.0000 | OROMUCOSAL | Status: DC | PRN
  Filled 2017-08-31: qty 177

## 2017-08-31 MED ORDER — SENNOSIDES-DOCUSATE SODIUM 8.6-50 MG PO TABS
1.0000 | ORAL_TABLET | Freq: Two times a day (BID) | ORAL | Status: DC
  Administered 2017-08-31 – 2017-09-03 (×6): 1 via ORAL
  Filled 2017-08-31 (×6): qty 1

## 2017-08-31 MED ORDER — LACTATED RINGERS IV SOLN
INTRAVENOUS | Status: DC
  Administered 2017-08-31: 22:00:00 via INTRAVENOUS

## 2017-08-31 MED ORDER — ACETAMINOPHEN 10 MG/ML IV SOLN
INTRAVENOUS | Status: AC
  Filled 2017-08-31: qty 100

## 2017-08-31 MED ORDER — SODIUM CHLORIDE 0.9 % IV SOLN
INTRAVENOUS | Status: DC
  Administered 2017-09-01 (×4): via INTRAVENOUS

## 2017-08-31 MED ORDER — MAGIC MOUTHWASH
5.0000 mL | Freq: Four times a day (QID) | ORAL | Status: DC
  Administered 2017-09-01 – 2017-09-03 (×3): 5 mL via ORAL
  Filled 2017-08-31 (×17): qty 10

## 2017-08-31 MED ORDER — ONDANSETRON HCL 4 MG/2ML IJ SOLN
INTRAMUSCULAR | Status: DC | PRN
  Administered 2017-08-31: 4 mg via INTRAVENOUS

## 2017-08-31 MED ORDER — FENTANYL CITRATE (PF) 100 MCG/2ML IJ SOLN
INTRAMUSCULAR | Status: AC
  Administered 2017-08-31: 25 ug via INTRAVENOUS
  Filled 2017-08-31: qty 2

## 2017-08-31 MED ORDER — MAGNESIUM HYDROXIDE 400 MG/5ML PO SUSP
30.0000 mL | Freq: Every day | ORAL | Status: DC
  Administered 2017-09-01 – 2017-09-03 (×3): 30 mL via ORAL
  Filled 2017-08-31 (×3): qty 30

## 2017-08-31 MED ORDER — PROPOFOL 10 MG/ML IV BOLUS
INTRAVENOUS | Status: DC | PRN
  Administered 2017-08-31: 170 mg via INTRAVENOUS
  Administered 2017-08-31: 30 mg via INTRAVENOUS

## 2017-08-31 MED ORDER — METOCLOPRAMIDE HCL 5 MG/ML IJ SOLN: 5.0000 mg | Freq: Three times a day (TID) | INTRAMUSCULAR | Status: DC | PRN

## 2017-08-31 MED ORDER — PREMIER PROTEIN SHAKE
11.0000 [oz_av] | Freq: Three times a day (TID) | ORAL | Status: DC
Start: 2017-09-01 — End: 2017-09-03
  Administered 2017-09-01: 11 [oz_av] via ORAL

## 2017-08-31 MED ORDER — EPHEDRINE SULFATE 50 MG/ML IJ SOLN
INTRAMUSCULAR | Status: DC | PRN
  Administered 2017-08-31: 10 mg via INTRAVENOUS

## 2017-08-31 MED ORDER — LIDOCAINE VISCOUS HCL 2 % MT SOLN
1.5000 mL | Freq: Four times a day (QID) | OROMUCOSAL | Status: DC
Start: 2017-08-31 — End: 2017-09-03
  Administered 2017-09-01: 1.5 mL via OROMUCOSAL
  Filled 2017-08-31 (×17): qty 15

## 2017-08-31 MED ORDER — MIDAZOLAM HCL 2 MG/2ML IJ SOLN
INTRAMUSCULAR | Status: AC
Start: 2017-08-31 — End: ?
  Filled 2017-08-31: qty 2

## 2017-08-31 MED ORDER — ONDANSETRON HCL 4 MG/2ML IJ SOLN: 4.0000 mg | Freq: Once | INTRAMUSCULAR | Status: DC | PRN

## 2017-08-31 MED ORDER — RIVAROXABAN 20 MG PO TABS
20.0000 mg | ORAL_TABLET | Freq: Every day | ORAL | Status: DC
  Administered 2017-09-01 – 2017-09-03 (×3): 20 mg via ORAL
  Filled 2017-08-31 (×3): qty 1

## 2017-08-31 MED ORDER — METOCLOPRAMIDE HCL 10 MG PO TABS: 5.0000 mg | ORAL_TABLET | Freq: Three times a day (TID) | ORAL | Status: DC | PRN

## 2017-08-31 MED ORDER — ACETAMINOPHEN 10 MG/ML IV SOLN
1000.0000 mg | Freq: Four times a day (QID) | INTRAVENOUS | Status: AC
  Administered 2017-09-01 (×4): 1000 mg via INTRAVENOUS
  Filled 2017-08-31 (×5): qty 100

## 2017-08-31 MED ORDER — SUGAMMADEX SODIUM 200 MG/2ML IV SOLN
INTRAVENOUS | Status: AC
  Filled 2017-08-31: qty 2

## 2017-08-31 MED ORDER — DEXAMETHASONE 4 MG PO TABS
2.0000 mg | ORAL_TABLET | Freq: Two times a day (BID) | ORAL | Status: DC
  Filled 2017-08-31 (×2): qty 0.5

## 2017-08-31 MED ORDER — ONDANSETRON HCL 4 MG/2ML IJ SOLN
4.0000 mg | Freq: Four times a day (QID) | INTRAMUSCULAR | Status: DC | PRN
  Administered 2017-09-02: 4 mg via INTRAVENOUS
  Filled 2017-08-31: qty 2

## 2017-08-31 MED ORDER — ACETAMINOPHEN 10 MG/ML IV SOLN
INTRAVENOUS | Status: DC | PRN
  Administered 2017-08-31: 1000 mg via INTRAVENOUS

## 2017-08-31 MED ORDER — ROCURONIUM BROMIDE 100 MG/10ML IV SOLN
INTRAVENOUS | Status: DC | PRN
  Administered 2017-08-31: 20 mg via INTRAVENOUS
  Administered 2017-08-31: 10 mg via INTRAVENOUS
  Administered 2017-08-31: 40 mg via INTRAVENOUS
  Administered 2017-08-31: 30 mg via INTRAVENOUS

## 2017-08-31 MED ORDER — NEOMYCIN-POLYMYXIN B GU 40-200000 IR SOLN
Status: DC | PRN
  Administered 2017-08-31: 4 mL

## 2017-08-31 MED ORDER — CEFAZOLIN SODIUM-DEXTROSE 2-4 GM/100ML-% IV SOLN
2.0000 g | Freq: Four times a day (QID) | INTRAVENOUS | Status: AC
  Administered 2017-08-31 – 2017-09-01 (×4): 2 g via INTRAVENOUS
  Filled 2017-08-31 (×4): qty 100

## 2017-08-31 MED ORDER — ONDANSETRON HCL 4 MG/2ML IJ SOLN
INTRAMUSCULAR | Status: AC
  Filled 2017-08-31: qty 2

## 2017-08-31 MED ORDER — TRAMADOL HCL 50 MG PO TABS
50.0000 mg | ORAL_TABLET | ORAL | Status: DC | PRN
  Administered 2017-09-01 – 2017-09-02 (×3): 100 mg via ORAL
  Filled 2017-08-31 (×3): qty 2
  Filled 2017-08-31: qty 1

## 2017-08-31 MED ORDER — OXYCODONE HCL 5 MG PO TABS
10.0000 mg | ORAL_TABLET | ORAL | Status: DC | PRN
  Administered 2017-08-31 – 2017-09-03 (×4): 10 mg via ORAL
  Filled 2017-08-31 (×3): qty 2

## 2017-08-31 MED ORDER — FENTANYL CITRATE (PF) 100 MCG/2ML IJ SOLN
INTRAMUSCULAR | Status: AC
  Filled 2017-08-31: qty 2

## 2017-08-31 MED ORDER — FLEET ENEMA 7-19 GM/118ML RE ENEM: 1.0000 | ENEMA | Freq: Once | RECTAL | Status: DC | PRN

## 2017-08-31 MED ORDER — FENTANYL CITRATE (PF) 100 MCG/2ML IJ SOLN
INTRAMUSCULAR | Status: DC | PRN
  Administered 2017-08-31 (×4): 50 ug via INTRAVENOUS

## 2017-08-31 MED ORDER — SUGAMMADEX SODIUM 200 MG/2ML IV SOLN
INTRAVENOUS | Status: DC | PRN
  Administered 2017-08-31: 200 mg via INTRAVENOUS

## 2017-08-31 MED ORDER — HYDROMORPHONE HCL 1 MG/ML IJ SOLN: 0.5000 mg | INTRAMUSCULAR | Status: DC | PRN

## 2017-08-31 MED ORDER — BISACODYL 10 MG RE SUPP
10.0000 mg | Freq: Every day | RECTAL | Status: DC | PRN
Start: 2017-08-31 — End: 2017-09-03
  Filled 2017-08-31: qty 1

## 2017-08-31 MED ORDER — PHENYLEPHRINE HCL 10 MG/ML IJ SOLN
INTRAMUSCULAR | Status: DC | PRN
  Administered 2017-08-31 (×17): 100 ug via INTRAVENOUS

## 2017-08-31 MED ORDER — ONDANSETRON HCL 4 MG PO TABS
4.0000 mg | ORAL_TABLET | Freq: Four times a day (QID) | ORAL | Status: DC | PRN
  Administered 2017-09-01: 4 mg via ORAL
  Filled 2017-08-31: qty 1

## 2017-08-31 MED ORDER — LACTATED RINGERS IV SOLN
INTRAVENOUS | Status: DC | PRN
  Administered 2017-08-31 (×2): via INTRAVENOUS

## 2017-08-31 MED ORDER — ROCURONIUM BROMIDE 50 MG/5ML IV SOLN
INTRAVENOUS | Status: AC
  Filled 2017-08-31: qty 1

## 2017-08-31 MED ORDER — MIDAZOLAM HCL 2 MG/2ML IJ SOLN
INTRAMUSCULAR | Status: DC | PRN
  Administered 2017-08-31: 2 mg via INTRAVENOUS

## 2017-08-31 MED ORDER — MENTHOL 3 MG MT LOZG
1.0000 | LOZENGE | OROMUCOSAL | Status: DC | PRN
  Filled 2017-08-31: qty 9

## 2017-08-31 MED ORDER — OXYCODONE HCL 5 MG PO TABS
5.0000 mg | ORAL_TABLET | ORAL | Status: DC | PRN
  Administered 2017-09-01 – 2017-09-03 (×7): 5 mg via ORAL
  Filled 2017-08-31 (×10): qty 1

## 2017-08-31 MED ORDER — SUCCINYLCHOLINE CHLORIDE 20 MG/ML IJ SOLN
INTRAMUSCULAR | Status: DC | PRN
  Administered 2017-08-31: 100 mg via INTRAVENOUS

## 2017-08-31 MED ORDER — ACETAMINOPHEN 325 MG PO TABS: 325.0000 mg | ORAL_TABLET | Freq: Four times a day (QID) | ORAL | Status: DC | PRN

## 2017-08-31 MED ORDER — LIDOCAINE HCL (CARDIAC) PF 100 MG/5ML IV SOSY
PREFILLED_SYRINGE | INTRAVENOUS | Status: DC | PRN
  Administered 2017-08-31: 100 mg via INTRAVENOUS

## 2017-08-31 MED ORDER — FENTANYL CITRATE (PF) 100 MCG/2ML IJ SOLN
25.0000 ug | INTRAMUSCULAR | Status: AC | PRN
  Administered 2017-08-31 (×6): 25 ug via INTRAVENOUS

## 2017-08-31 MED ORDER — SODIUM CHLORIDE 0.9 % IV SOLN
INTRAVENOUS | Status: DC
  Administered 2017-08-31: 08:00:00 via INTRAVENOUS

## 2017-08-31 SURGICAL SUPPLY — 36 items
BLADE TI HELICAL 11.0 115 (Orthopedic Implant) ×2 IMPLANT
BLADE TI HELICAL 11.0MM 115MM (Orthopedic Implant) ×1 IMPLANT
CANISTER SUCT 1200ML W/VALVE (MISCELLANEOUS) ×3 IMPLANT
COVER LIGHT HANDLE STERIS (MISCELLANEOUS) ×3 IMPLANT
DRAPE C-ARMOR (DRAPES) ×3 IMPLANT
DRAPE INCISE IOBAN 66X45 STRL (DRAPES) ×3 IMPLANT
DRAPE SHEET LG 3/4 BI-LAMINATE (DRAPES) ×3 IMPLANT
DRSG DERMACEA 8X12 NADH (GAUZE/BANDAGES/DRESSINGS) ×3 IMPLANT
DRSG OPSITE POSTOP 3X4 (GAUZE/BANDAGES/DRESSINGS) ×3 IMPLANT
DRSG OPSITE POSTOP 4X12 (GAUZE/BANDAGES/DRESSINGS) ×3 IMPLANT
DURAPREP 26ML APPLICATOR (WOUND CARE) ×3 IMPLANT
ELECT REM PT RETURN 9FT ADLT (ELECTROSURGICAL) ×3
ELECTRODE REM PT RTRN 9FT ADLT (ELECTROSURGICAL) ×1 IMPLANT
GAUZE SPONGE 4X4 12PLY STRL (GAUZE/BANDAGES/DRESSINGS) ×3 IMPLANT
GLOVE BIOGEL M STRL SZ7.5 (GLOVE) ×9 IMPLANT
GLOVE INDICATOR 8.0 STRL GRN (GLOVE) ×9 IMPLANT
GOWN STRL REUS W/ TWL LRG LVL3 (GOWN DISPOSABLE) ×3 IMPLANT
GOWN STRL REUS W/TWL LRG LVL3 (GOWN DISPOSABLE) ×6
KIT TURNOVER CYSTO (KITS) ×3 IMPLANT
MAT BLUE FLOOR 46X72 FLO (MISCELLANEOUS) ×3 IMPLANT
NAIL TROCH FX 11/130D 460/R-S (Nail) ×3 IMPLANT
NS IRRIG 1000ML POUR BTL (IV SOLUTION) ×3 IMPLANT
PACK HIP COMPR (MISCELLANEOUS) ×3 IMPLANT
REAMER ROD DEEP FLUTE 2.5X950 (INSTRUMENTS) ×3 IMPLANT
SCREW LOCK TI 5.0X60 (Screw) ×3 IMPLANT
SCREW LOCKING IM 5.0MX52M (Screw) ×3 IMPLANT
SOL PREP PVP 2OZ (MISCELLANEOUS) ×3
SOLUTION PREP PVP 2OZ (MISCELLANEOUS) ×1 IMPLANT
STAPLER SKIN PROX 35W (STAPLE) ×3 IMPLANT
SUCTION FRAZIER HANDLE 10FR (MISCELLANEOUS) ×2
SUCTION TUBE FRAZIER 10FR DISP (MISCELLANEOUS) ×1 IMPLANT
SUT VIC AB 0 CT1 36 (SUTURE) ×3 IMPLANT
SUT VIC AB 1 CT1 36 (SUTURE) ×3 IMPLANT
SUT VIC AB 2-0 CT1 27 (SUTURE) ×2
SUT VIC AB 2-0 CT1 TAPERPNT 27 (SUTURE) ×1 IMPLANT
TAPE MICROFOAM 4IN (TAPE) IMPLANT

## 2017-08-31 NOTE — NC FL2 (Signed)
[place] LEVEL OF CARE SCREENING TOOL     IDENTIFICATION  Patient Name: Jason Cooper Birthdate: September 03, 1945 Sex: male Admission Date (Current Location): 08/30/2017  [place] and [place] Number:  Engineering geologist and Address:  [facility], [address] [place], [place] [address]      Provider Number: [id number]  Attending Physician Name and Address:  Gorden Harms, MD  Relative Name and Phone Number:       Current Level of Care: Hospital Recommended Level of Care: [place] Prior Approval Number:    Date Approved/Denied:   PASRR Number: [id number])  Discharge Plan: SNF    Current Diagnoses: Patient Active Problem List   Diagnosis Date Noted  . Hip fracture ([place]) 08/30/2017  . Dysgeusia 08/01/2017  . Severe protein-calorie malnutrition ([place]) 08/01/2017  . Cough 04/21/2017  . Subacute sinusitis 04/21/2017  . Elevated PSA 01/22/2017  . Metastasis from esophageal cancer ([place]) 07/29/2016  . Acute pulmonary embolism ([place]) 11/28/2015  . 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 Malignant neoplasm of lower third of esophagus ([place]) 09/15/2015  . Syncopal episodes 09/15/2015  . Esophageal adenocarcinoma ([place]) 09/14/2015  . Subclinical hyperthyroidism 07/15/2015  . Advanced care planning/counseling discussion 07/11/2014  . Knee joint replaced by other means 06/29/2014  . Medicare annual wellness visit, subsequent 07/09/2013  . HTN (hypertension)   . HLD (hyperlipidemia)   . Controlled type 2 diabetes mellitus without complication, without long-term current use of insulin ([place])   . Seasonal allergic rhinitis   . Osteoarthritis     Orientation RESPIRATION BLADDER Height & Weight     Self, Time, Situation, Place  Normal Continent Weight: 216 lb (98 kg) Height:  6\' 3"  (190.5 cm)  BEHAVIORAL SYMPTOMS/MOOD NEUROLOGICAL BOWEL NUTRITION STATUS    Convulsions/Seizures(History of  seizures. ) Continent Diet(Diet: NPO for surgery to be advanced. )  AMBULATORY STATUS COMMUNICATION OF NEEDS Skin   Extensive Assist Verbally Surgical wounds                       Personal Care Assistance Level of Assistance  Bathing, Feeding, Dressing Bathing Assistance: Limited assistance Feeding assistance: Independent Dressing Assistance: Limited assistance     Functional Limitations Info  Sight, Hearing, Speech Sight Info: Adequate Hearing Info: Adequate Speech Info: Adequate    SPECIAL CARE FACTORS FREQUENCY  PT (By licensed PT), OT (By licensed OT)     PT Frequency: (5) OT Frequency: (5)            Contractures      Additional Factors Info  Code Status, Allergies Code Status Info: (Full Code. ) Allergies Info: (No Known Allergies. )           Current Medications (08/31/2017):  This is the current hospital active medication list Current Facility-Administered Medications  Medication Dose Route Frequency Provider Last Rate Last Dose  . 0.9 %  sodium chloride infusion   Intravenous Continuous Dereck Leep, MD 125 mL/hr at 08/31/17 0758    . acetaminophen (TYLENOL) tablet 650 mg  650 mg Oral Q6H PRN Loletha Grayer, MD       Or  . acetaminophen (TYLENOL) suppository 650 mg  650 mg Rectal Q6H PRN Wieting, Richard, MD      . baclofen (LIORESAL) tablet 10 mg  10 mg Oral QID Wieting, Richard, MD      . ceFAZolin (ANCEF) IVPB 2g/100 mL premix  2 g Intravenous To OR Hooten, Laurice Record,  MD      . dexamethasone (DECADRON) tablet 2 mg  2 mg Oral BID Loletha Grayer, MD   2 mg at 08/31/17 0929  . ferrous sulfate tablet 325 mg  325 mg Oral Daily Loletha Grayer, MD   Stopped at 08/31/17 [phone]  . FIRST-MOUTHWASH BLM SUSP 5 mL  5 mL Oral QID Loletha Grayer, MD   5 mL at 08/31/17 0759  . gabapentin (NEURONTIN) capsule 300 mg  300 mg Oral Daily Loletha Grayer, MD   Stopped at 08/31/17 [phone]  . losartan (COZAAR) tablet 25 mg  25 mg Oral Daily Loletha Grayer, MD    Stopped at 08/31/17 [phone]  . morphine 4 MG/ML injection 4 mg  4 mg Intravenous Q3H PRN Loletha Grayer, MD   4 mg at 08/30/17 2315  . multivitamin with minerals tablet 1 tablet  1 tablet Oral Daily Loletha Grayer, MD   Stopped at 08/31/17 [phone]  . OLANZapine zydis (ZYPREXA) disintegrating tablet 5 mg  5 mg Oral Daily Loletha Grayer, MD   Stopped at 08/31/17 [phone]  . ondansetron (ZOFRAN) tablet 4 mg  4 mg Oral Q6H PRN Loletha Grayer, MD   4 mg at 08/30/17 1848   Or  . ondansetron (ZOFRAN) injection 4 mg  4 mg Intravenous Q6H PRN Wieting, Richard, MD      . oxyCODONE (Oxy IR/ROXICODONE) immediate release tablet 5 mg  5 mg Oral Q4H PRN Loletha Grayer, MD   5 mg at 08/31/17 0743  . pantoprazole (PROTONIX) EC tablet 40 mg  40 mg Oral BID AC Loletha Grayer, MD   40 mg at 08/31/17 0929  . simvastatin (ZOCOR) tablet 40 mg  40 mg Oral QHS Loletha Grayer, MD   40 mg at 08/30/17 2039  . vitamin B-12 (CYANOCOBALAMIN) tablet 500 mcg  500 mcg Oral Daily Loletha Grayer, MD   Stopped at 08/31/17 [phone]     Discharge Medications: Please see discharge summary for a list of discharge medications.  Relevant Imaging Results:  Relevant Lab Results:   Additional Information (SSN: [id number])  Morey Andonian, Veronia Beets, LCSW

## 2017-08-31 NOTE — Anesthesia Post-op Follow-up Note (Signed)
Anesthesia QCDR form completed.        

## 2017-08-31 NOTE — Anesthesia Preprocedure Evaluation (Addendum)
Anesthesia Evaluation  Patient identified by MRN, date of birth, ID band Patient awake    Reviewed: Allergy & Precautions, H&P , NPO status , Patient's Chart, lab work & pertinent test results, reviewed documented beta blocker date and time   History of Anesthesia Complications Negative for: history of anesthetic complications  Airway Mallampati: III  TM Distance: <3 FB Neck ROM: full    Dental  (+) Caps, Teeth Intact   Pulmonary neg pulmonary ROS, former smoker,    Pulmonary exam normal breath sounds clear to auscultation       Cardiovascular Exercise Tolerance: Good hypertension, On Medications (-) angina+ CAD  (-) Past MI, (-) Cardiac Stents and (-) CABG Normal cardiovascular exam(-) dysrhythmias (-) Valvular Problems/Murmurs Rhythm:regular Rate:Normal     Neuro/Psych  Headaches, Seizures -,  negative neurological ROS  negative psych ROS   GI/Hepatic Neg liver ROS, GERD  ,  Endo/Other  diabetes (borderline)Hypothyroidism (subclinical) Hyperthyroidism   Renal/GU negative Renal ROS  negative genitourinary   Musculoskeletal  (+) Arthritis , Osteoarthritis,    Abdominal   Peds  Hematology negative hematology ROS (+)   Anesthesia Other Findings Past Medical History:   Osteoarthritis                                                 Comment:s/p bilat knee replacements   History of chicken pox                                       Prediabetes                                     2007         Seasonal allergic rhinitis                                   HTN (hypertension)                                           HLD (hyperlipidemia)                                         Cluster headache                                               Comment:as 72 yo, no more since retired (25 yrs ago)   H/O malaria                                     1969           Comment:during [place] (hospitalized in [place])   Coronary artery  disease  Diabetes mellitus without complication ([place])                 Environmental allergies                                      H/O varicella                                                Malaria                                                    Esophageal CA  Reproductive/Obstetrics negative OB ROS                            Anesthesia Physical  Anesthesia Plan  ASA: II  Anesthesia Plan: General   Post-op Pain Management:    Induction: Intravenous, Rapid sequence and Cricoid pressure planned  PONV Risk Score and Plan:   Airway Management Planned: Oral ETT  Additional Equipment:   Intra-op Plan:   Post-operative Plan: Extubation in OR  Informed Consent: I have reviewed the patients History and Physical, chart, labs and discussed the procedure including the risks, benefits and alternatives for the proposed anesthesia with the patient or authorized representative who has indicated his/her understanding and acceptance.   Dental Advisory Given  Plan Discussed with: Anesthesiologist, CRNA and Surgeon  Anesthesia Plan Comments:         Anesthesia Quick Evaluation

## 2017-08-31 NOTE — Progress Notes (Signed)
Jason Cooper at [place] NAME: Jason Cooper    MR#:  [id number]  DATE OF BIRTH:  1946/02/03  SUBJECTIVE:  CHIEF COMPLAINT:   Chief Complaint  Patient presents with  . Hip Pain  For operative intervention later today, patient now wishes to be partial code-patient does not want electrical shocks/ventilator placement/okay for CPR outside of operating room  REVIEW OF SYSTEMS:  CONSTITUTIONAL: No fever, fatigue or weakness.  EYES: No blurred or double vision.  EARS, NOSE, AND THROAT: No tinnitus or ear pain.  RESPIRATORY: No cough, shortness of breath, wheezing or hemoptysis.  CARDIOVASCULAR: No chest pain, orthopnea, edema.  GASTROINTESTINAL: No nausea, vomiting, diarrhea or abdominal pain.  GENITOURINARY: No dysuria, hematuria.  ENDOCRINE: No polyuria, nocturia,  HEMATOLOGY: No anemia, easy bruising or bleeding SKIN: No rash or lesion. MUSCULOSKELETAL: No joint pain or arthritis.   NEUROLOGIC: No tingling, numbness, weakness.  PSYCHIATRY: No anxiety or depression.   ROS  DRUG ALLERGIES:  No Known Allergies  VITALS:  Blood pressure 126/80, pulse 78, temperature 98.7 F (37.1 C), temperature source Oral, resp. rate 18, height 6\' 3"  (1.905 m), weight 98 kg (216 lb), SpO2 98 %.  PHYSICAL EXAMINATION:  GENERAL:  72 y.o.-year-old patient lying in the bed with no acute distress.  EYES: Pupils equal, round, reactive to light and accommodation. No scleral icterus. Extraocular muscles intact.  HEENT: Head atraumatic, normocephalic. Oropharynx and nasopharynx clear.  NECK:  Supple, no jugular venous distention. No thyroid enlargement, no tenderness.  LUNGS: Normal breath sounds bilaterally, no wheezing, rales,rhonchi or crepitation. No use of accessory muscles of respiration.  CARDIOVASCULAR: S1, S2 normal. No murmurs, rubs, or gallops.  ABDOMEN: Soft, nontender, nondistended. Bowel sounds present. No organomegaly or mass.  EXTREMITIES: No pedal  edema, cyanosis, or clubbing.  NEUROLOGIC: Cranial nerves II through XII are intact. Muscle strength 5/5 in all extremities. Sensation intact. Gait not checked.  PSYCHIATRIC: The patient is alert and oriented x 3.  SKIN: No obvious rash, lesion, or ulcer.   Physical Exam LABORATORY PANEL:   CBC Recent Labs  Lab 08/31/17 0419  WBC 3.8  HGB 11.0*  HCT 33.2*  PLT 119*   ------------------------------------------------------------------------------------------------------------------  Chemistries  Recent Labs  Lab 08/30/17 1319 08/31/17 0419  NA 137 137  K 4.0 4.0  CL 105 107  CO2 19* 24  GLUCOSE 145* 119*  BUN 15 17  CREATININE 0.94 0.71  CALCIUM 8.7* 7.9*  AST 55*  --   ALT 45  --   ALKPHOS 155*  --   BILITOT 0.7  --    ------------------------------------------------------------------------------------------------------------------  Cardiac Enzymes No results for input(s): TROPONINI in the last 168 hours. ------------------------------------------------------------------------------------------------------------------  RADIOLOGY:  Dg Chest 1 View  Result Date: 08/30/2017 CLINICAL DATA:  Status post fall. EXAM: CHEST  1 VIEW COMPARISON:  09/15/2015 FINDINGS: Right-sided Port-A-Cath with the tip projecting over the cavoatrial junction. There is no focal parenchymal opacity. There is no pleural effusion or pneumothorax. The heart and mediastinal contours are unremarkable. The osseous structures are unremarkable. IMPRESSION: No active disease. Electronically Signed   By: Kathreen Devoid   On: 08/30/2017 14:16   Dg Hip Unilat  With Pelvis 2-3 Views Right  Result Date: 08/30/2017 CLINICAL DATA:  Right hip pain after fall today. EXAM: DG HIP (WITH OR WITHOUT PELVIS) 2-3V RIGHT COMPARISON:  None. FINDINGS: Severely comminuted and displaced fracture is seen involving the intertrochanteric region of the proximal right femur which extends into proximal femoral shaft. Femoral  head  remains situated within the acetabulum. IMPRESSION: Severely comminuted and displaced intertrochanteric fracture of proximal right femur which extends into proximal femoral shaft. Electronically Signed   By: Marijo Conception, M.D.   On: 08/30/2017 14:16    ASSESSMENT AND PLAN:  *Acute right hip fracture Status post mechanical fall For operative intervention later today by Dr. Hooten/orthopedic surgery Xarelto held   *Acute Metastatic esophageal cancer to the liver Stable  Will need to continue to follow-up with his Oncology status post discharge for continued medical management/care  *History of PE/DVT Noted history of cancer above Xarelto on held for procedure  *Chronic benign essential hypertension Stable on current regiment  *Chronic GERD  PPI daily  *One seizure  Stable  One seizure in the past - on no medications   All the records are reviewed and case discussed with Care Management/Social Workerr. Management plans discussed with the patient, family and they are in agreement.  CODE STATUS: No electrical shocks/DNI  TOTAL TIME TAKING CARE OF THIS PATIENT: 40 minutes.     POSSIBLE D/C IN 2-5 DAYS, DEPENDING ON CLINICAL CONDITION.   Jason Cooper M.D on 08/31/2017   Between 7am to 6pm - Pager - [phone]  After 6pm go to [email or web address] - password [email or web address] [facility]  Office  [phone]  CC: Primary care physician; Jason Bush, MD  Note: This dictation was prepared with Dragon dictation along with smaller phrase technology. Any transcriptional errors that result from this process are unintentional.

## 2017-08-31 NOTE — Op Note (Signed)
OPERATIVE NOTE  DATE OF SURGERY:  08/31/2017  PATIENT NAME:  Jason Cooper   DOB: [birth year]  MRN: [id number]  PRE-OPERATIVE DIAGNOSIS: Right pertrochanteric femur fracture  POST-OPERATIVE DIAGNOSIS:  Same  PROCEDURE: Open reduction and internal fixation of a right pertrochanteric femur fracture   SURGEON:  Marciano Sequin. M.D.  ANESTHESIA: general  ESTIMATED BLOOD LOSS: 200 mL  FLUIDS REPLACED: 1300 mL of crystalloid  DRAINS: None  IMPLANTS UTILIZED: Synthes 11 mm x 460 mm/130 trochanteric fixation nail, 009 mm helical blade, 60 mm x 5.0 mm and 52 mm x 5.0 mm locking screws  INDICATIONS FOR SURGERY: Jason Cooper is a 72 y.o. year old male who fell and sustained a displaced right comminuted pertrochanteric femur fracture. After discussion of the risks and benefits of surgical intervention, the patient expressed understanding of the risks benefits and agree with plans for open reduction and internal fixation.   The risks, benefits, and alternatives were discussed at length including but not limited to the risks of infection, bleeding, nerve injury, stiffness, blood clots, the need for revision surgery, limb length inequality, cardiopulmonary complications, among others, and they were willing to proceed.  PROCEDURE IN DETAIL: The patient was brought into the operating room and, after adequate general anesthesia was achieved, patient was placed on the fracture table. All bony prominences were well-padded. The right lower extremity was placed in traction and a provisional reduction was performed and verified using the C-arm. The patient's right hip and leg were cleaned and prepped with alcohol and DuraPrep and draped in the usual sterile fashion. A "timeout" was performed as per usual protocol. A lateral incision was made extended from the proximal portion of the greater trochanter proximally. The fascia was incised in line with the skin incision and the fibers of the hip  abductors were split in line. The tip of the greater trochanter was palpated and a distally threaded guide pin was inserted into the tip of the greater trochanter and advanced into the medullary canal. Position was confirmed in both AP and lateral planes using the C-arm. A pilot hole was enlarged using a step drill.  The initial guide wire was replaced with a distally beaded guidewire for reaming of the femoral canal.  Position was confirmed both AP and lateral planes and measurements were obtained, confirming that a 46 mm length would be appropriate.  The femoral canal was then reamed in a sequential fashion up to a 12 mm diameter.  A Synthes 11 mm x 460 mm/130 trochanteric fixation nail was advanced over the guidepin and position confirmed using the C-arm. A second stab incision was made and the tissue protector was inserted through the outrigger device and advanced to the lateral cortex of the femur. A threaded screw guide pin was inserted into the femoral neck and head and position was again confirmed in both AP and lateral planes.  Measurements were obtained and it was felt that a 381 mm helical blade was appropriate. The cortex was reamed and then a cannulated reamer was advanced over the guidepin to the appropriate depth. A 829 mm helical blade was then advanced over the guidepin and impacted into place. Good position was noted in multiple planes using the C-arm. The locking sleeve was engaged. Finally, the C-arm was positioned so as to visualize the distal locking holes near the knee.  Stab incision was made distally and a 60 mm x 5.0 mm locking screw and a 52 mm x 5.0 mm locking screw were  inserted into the distal locking holes.  Position was confirmed in both AP and lateral planes using the C arm.. The outrigger device was removed. The hip was visualized in all planes using the C-arm with good reduction appreciated and good position of the hardware noted.  The wound was irrigated with copious amounts of  normal saline with antibiotic solution and suctioned dry. Good hemostasis was appreciated. The fascia was reapproximated using interrupted sutures of #1 Vicryl. Subcutaneous tissue was approximated layers using first #0 Vicryl followed #2-0 Vicryl. The skin was closed with skin staples. A sterile dressing was applied.  The patient tolerated the procedure well and was transported to the recovery room in stable condition.   Marciano Sequin., M.D.

## 2017-08-31 NOTE — Progress Notes (Signed)
Family Meeting Note  Advance Directive:yes  Today a meeting took place with the Patient, son, daughter-in-law, [name].  Patient is able to participate   The following clinical team members were present during this meeting:MD  The following were discussed:Patient's diagnosis: Stage IV esophageal cancer, Patient's progosis: Unable to determine and Goals for treatment: DNI, no electrical shocks/defibrillation  Additional follow-up to be provided: prn  Time spent during discussion:20 minutes  Gorden Harms, MD

## 2017-08-31 NOTE — Anesthesia Procedure Notes (Signed)
Procedure Name: Intubation Date/Time: 08/31/2017 5:51 PM Performed by: Aline Brochure, CRNA Pre-anesthesia Checklist: Patient identified, Emergency Drugs available, Suction available and Patient being monitored Patient Re-evaluated:Patient Re-evaluated prior to induction Oxygen Delivery Method: Circle system utilized Preoxygenation: Pre-oxygenation with 100% oxygen Induction Type: IV induction Ventilation: Mask ventilation without difficulty Laryngoscope Size: McGraph and 4 Grade View: Grade I Tube type: Oral Tube size: 7.0 mm Number of attempts: 1 Airway Equipment and Method: Stylet and Video-laryngoscopy Placement Confirmation: ETT inserted through vocal cords under direct vision,  positive ETCO2 and breath sounds checked- equal and bilateral Secured at: 22 cm Tube secured with: Tape Dental Injury: Teeth and Oropharynx as per pre-operative assessment

## 2017-08-31 NOTE — Anesthesia Postprocedure Evaluation (Signed)
Anesthesia Post Note  Patient: Jason Cooper  Procedure(s) Performed: INTRAMEDULLARY (IM) NAIL INTERTROCHANTRIC (Right Hip)  Patient location during evaluation: PACU Anesthesia Type: General Level of consciousness: awake and alert and oriented Pain management: pain level controlled Vital Signs Assessment: post-procedure vital signs reviewed and stable Respiratory status: spontaneous breathing Cardiovascular status: blood pressure returned to baseline Anesthetic complications: no     Last Vitals:  Vitals:   08/31/17 2105 08/31/17 2110  BP:  93/70  Pulse: 100 (!) 101  Resp: 17 18  Temp:    SpO2: 100% 99%    Last Pain:  Vitals:   08/31/17 2058  TempSrc:   PainSc: 0-No pain                 Emmaleah Meroney

## 2017-08-31 NOTE — Clinical Social Work Placement (Signed)
   CLINICAL SOCIAL WORK PLACEMENT  NOTE  Date:  08/31/2017  Patient Details  Name: Jason Cooper MRN: [id number] Date of Birth: Dec 16, 1945  Clinical Social Work is seeking post-discharge placement for this patient at the [place] level of care (*CSW will initial, date and re-position this form in  chart as items are completed):  Yes   Patient/family provided with [place] Work Department's list of facilities offering this level of care within the geographic area requested by the patient (or if unable, by the patient's family).  Yes   Patient/family informed of their freedom to choose among providers that offer the needed level of care, that participate in Medicare, Medicaid or managed care program needed by the patient, have an available bed and are willing to accept the patient.  Yes   Patient/family informed of [facility]'s ownership interest in [facility] and [facility], as well as of the fact that they are under no obligation to receive care at these facilities.  PASRR submitted to EDS on 08/31/17     PASRR number received on 08/31/17     Existing PASRR number confirmed on       FL2 transmitted to all facilities in geographic area requested by pt/family on 08/31/17     FL2 transmitted to all facilities within larger geographic area on       Patient informed that his/her managed care company has contracts with or will negotiate with certain facilities, including the following:            Patient/family informed of bed offers received.  Patient chooses bed at       Physician recommends and patient chooses bed at      Patient to be transferred to   on  .  Patient to be transferred to facility by       Patient family notified on   of transfer.  Name of family member notified:        PHYSICIAN       Additional Comment:    _______________________________________________ Rjay Revolorio, Veronia Beets, LCSW 08/31/2017, 10:01 AM

## 2017-08-31 NOTE — Progress Notes (Signed)
ORTHOPAEDICS: Plan for surgery were reviewed with the patient and his wife this morning.  All questions were answered.  The surgical site was signed as per the "right site surgery" protocol.   Alese Furniss P. Holley Bouche M.D.

## 2017-08-31 NOTE — Progress Notes (Signed)
Initial Nutrition Assessment  DOCUMENTATION CODES:   Non-severe (moderate) malnutrition in context of chronic illness  INTERVENTION:  Provide Premier Protein po TID, each supplement provides 160 kcal and 30 grams of protein.  Continue daily MVI.  Encouraged intake of small, frequent meals throughout the day. Discussed continuing intake of calorie- and protein-dense foods. Discussed that if patient cannot find any ONS he likes to drink, he can get a tasteless protein powder to add into foods and beverages he is already taking in.  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   -Patient scheduled for right intertrochanteric IM nail today in OR.  Met with patient and multiple family members at bedside. Patient reports acutely he has had a decreased appetite for the past 4 weeks. He also had a decreased appetite and intake during XRT. Last XRT was 07/14/2017 per his report. He is eating about 2 meals per day now and occasional snacks. For breakfast he has eggs, toast, and "crispy" bacon (pt reports he can tolerate crispy bacon and toast). For lunch he may have some soft foods. Dinner is usually a snack and may be pudding or ice cream. He is not currently drinking an oral nutrition  supplement. He used to drink a chocolate Nestle high protein shake and has also previously had Ensure and Boost, but reports he does not like these. He is amenable to trying Premier Protein. He reports he does not have any issues with swallowing as long as he takes his time and chews his food well. He mainly drinks room temperature beverages.  Patient was initially diagnosed in Spring 2017. On 07/15/2015 patient was 268.25 lbs. On 07/22/2016 he was 258.8 lbs, so he only lost 10 lbs that year. RD obtained bed scale weight of 92.4 kg today (203.7 lbs). Patient has lost 55.1 lbs (21.3% body weight) over the past year, which is significant for time frame.  Medications reviewed and include: Decadron 2 mg BID, ferrous sulfate 325 mg daily, gabapentin, magic mouthwash, viscous lidocaine, MVI daily, pantoprazole, vitamin B12 500 micrograms daily, NS @ 75 mL/hr, cefazolin.  Labs reviewed.  Discussed with RN.  NUTRITION - FOCUSED PHYSICAL EXAM:    Most Recent Value  Orbital Region  Moderate depletion  Upper Arm Region  Moderate depletion  Thoracic and Lumbar Region  Mild depletion  Buccal Region  Moderate depletion  Temple Region  Moderate depletion  Clavicle Bone Region  Moderate depletion  Clavicle and Acromion Bone Region  Moderate depletion  Scapular Bone Region  Moderate depletion  Dorsal Hand  Mild depletion  Patellar Region  Mild depletion  Anterior Thigh Region  Mild depletion  Posterior Calf Region  Mild depletion  Edema (RD Assessment)  None  Hair  Reviewed  Eyes  Reviewed  Mouth  Reviewed  Skin  Reviewed  Nails  Reviewed     Diet Order:   Diet Order           Diet NPO time specified Except for: Sips with Meds  Diet effective midnight          EDUCATION NEEDS:   Education needs have been addressed  Skin:  Skin Assessment: Reviewed RN Assessment  Last BM:  PTA (08/28/2017 per chart)  Height:   Ht Readings from Last 1 Encounters:  08/30/17 6' 3"  (1.905 m)    Weight:    Wt Readings from Last 1 Encounters:  08/31/17 203 lb 11.3 oz (92.4 kg)    Ideal Body Weight:  89.1 kg  BMI:  Body mass index is 25.46 kg/m.  Estimated Nutritional Needs:   Kcal:  8250-5397 (MSJ x 1.2-1.4)  Protein:  110-130 grams (1.2-1.4 grams/kg)  Fluid:  2.1-2.5 L/day (1 mL/kcal)  Willey Blade, MS, RD, LDN Office: [phone] Pager: [phone] After Hours/Weekend Pager: [phone]

## 2017-08-31 NOTE — Transfer of Care (Signed)
Immediate Anesthesia Transfer of Care Note  Patient: Jason Cooper  Procedure(s) Performed: INTRAMEDULLARY (IM) NAIL INTERTROCHANTRIC (Right Hip)  Patient Location: PACU  Anesthesia Type:General  Level of Consciousness: sedated  Airway & Oxygen Therapy: Patient connected to face mask oxygen  Post-op Assessment: Post -op Vital signs reviewed and stable  Post vital signs: stable  Last Vitals:  Vitals Value Taken Time  BP 103/54 08/31/2017  9:00 PM  Temp 36.1 C 08/31/2017  8:58 PM  Pulse 99 08/31/2017  9:00 PM  Resp 20 08/31/2017  9:00 PM  SpO2 100 % 08/31/2017  9:00 PM    Last Pain:  Vitals:   08/31/17 1514  TempSrc: Oral  PainSc:       Patients Stated Pain Goal: 3 (82/88/33 7445)  Complications: No apparent anesthesia complications

## 2017-08-31 NOTE — Clinical Social Work Note (Signed)
Clinical Social Work Assessment  Patient Details  Name: Jason Cooper MRN: [id number] Date of Birth: 02/22/1946  Date of referral:  08/31/17               Reason for consult:  Facility Placement                Permission sought to share information with:  Chartered certified accountant granted to share information::  Yes, Verbal Permission Granted  Name::      [place]::   [facility]   Relationship::     Contact Information:     Housing/Transportation Living arrangements for the past 2 months:  [place] of Information:  Patient, Adult Children Patient Interpreter Needed:  None Criminal Activity/Legal Involvement Pertinent to Current Situation/Hospitalization:  No - Comment as needed Significant Relationships:  Adult Children, Spouse Lives with:  Spouse Do you feel safe going back to the place where you live?  Yes Need for family participation in patient care:  Yes (Comment)  Care giving concerns:  Patient lives in [place] with his wife Volney Presser.    Social Worker assessment / plan:  Holiday representative ([place]) reviewed chart and noted that patient has a hip fracture. Surgery and PT are pending. CSW met with patient prior to surgery today and his daughter Lenna Sciara was at bedside. Patient was alert and oriented X4 and was laying in the bed. CSW introduced self and explained role of CSW department. Patient reported that he lives in [place] with his wife and has 1 adult daughter and 1 adult son. Per patient he has 5 grandchildren. Patient reported that he goes to chemo at [facility] every other Monday (twice per month). Per patient his family transports him to chemo and will continue to do so. CSW explained that after surgery PT will evaluate patient and make a recommendation of home health or SNF. Patient prefers to go home with [facility] but is open to SNF if needed. FL2 complete and faxed out. CSW will continue to follow and assist  as needed.    Employment status:  Retired Forensic scientist:  Medicare PT Recommendations:  Not assessed at this time [place] / Referral to community resources:  [place]  Patient/Family's Response to care:  Patient prefers home health but is open to SNF if needed.   Patient/Family's Understanding of and Emotional Response to Diagnosis, Current Treatment, and Prognosis:  Patient was very pleasant and thanked CSW for assistance.   Emotional Assessment Appearance:  Appears stated age Attitude/Demeanor/Rapport:    Affect (typically observed):  Accepting, Adaptable, Pleasant Orientation:  Oriented to Self, Oriented to Place, Oriented to  Time, Oriented to Situation Alcohol / Substance use:  Not Applicable Psych involvement (Current and /or in the community):  No (Comment)  Discharge Needs  Concerns to be addressed:  Discharge Planning Concerns Readmission within the last 30 days:  No Current discharge risk:  Dependent with Mobility Barriers to Discharge:  Continued Medical Work up   [employer], Veronia Beets, LCSW 08/31/2017, 10:04 AM

## 2017-09-01 ENCOUNTER — Encounter: Payer: Self-pay | Admitting: Orthopedic Surgery

## 2017-09-01 LAB — CBC
HCT: 26.2 % — ABNORMAL LOW (ref 40.0–52.0)
Hemoglobin: 8.9 g/dL — ABNORMAL LOW (ref 13.0–18.0)
MCH: 31.1 pg (ref 26.0–34.0)
MCHC: 33.9 g/dL (ref 32.0–36.0)
MCV: 91.7 fL (ref 80.0–100.0)
PLATELETS: 103 10*3/uL — AB (ref 150–440)
RBC: 2.86 MIL/uL — ABNORMAL LOW (ref 4.40–5.90)
RDW: 19.6 % — AB (ref 11.5–14.5)
WBC: 3 10*3/uL — AB (ref 3.8–10.6)

## 2017-09-01 LAB — BASIC METABOLIC PANEL
Anion gap: 8 (ref 5–15)
BUN: 21 mg/dL — ABNORMAL HIGH (ref 6–20)
CALCIUM: 7.5 mg/dL — AB (ref 8.9–10.3)
CO2: 22 mmol/L (ref 22–32)
Chloride: 106 mmol/L (ref 101–111)
Creatinine, Ser: 0.85 mg/dL (ref 0.61–1.24)
GFR calc Af Amer: >60 mL/min (ref >60)
GLUCOSE: 120 mg/dL — AB (ref 65–99)
Potassium: 4.7 mmol/L (ref 3.5–5.1)
SODIUM: 136 mmol/L (ref 135–145)

## 2017-09-01 LAB — CORTISOL: CORTISOL PLASMA: 11.6 ug/dL

## 2017-09-01 MED ORDER — HYDROCORTISONE NA SUCCINATE PF 100 MG IJ SOLR
100.0000 mg | Freq: Three times a day (TID) | INTRAMUSCULAR | Status: AC
  Administered 2017-09-01 – 2017-09-02 (×6): 100 mg via INTRAVENOUS
  Filled 2017-09-01 (×6): qty 2

## 2017-09-01 MED ORDER — SODIUM CHLORIDE 0.9 % IV BOLUS
1000.0000 mL | Freq: Once | INTRAVENOUS | Status: AC
  Administered 2017-09-01: 1000 mL via INTRAVENOUS

## 2017-09-01 MED ORDER — SODIUM CHLORIDE 0.9 % IV BOLUS: 500.0000 mL | Freq: Once | INTRAVENOUS | Status: DC

## 2017-09-01 MED ORDER — METAXALONE 800 MG PO TABS
800.0000 mg | ORAL_TABLET | Freq: Three times a day (TID) | ORAL | Status: DC | PRN
  Administered 2017-09-01 – 2017-09-02 (×2): 800 mg via ORAL
  Filled 2017-09-01 (×2): qty 1

## 2017-09-01 MED ORDER — ALUM & MAG HYDROXIDE-SIMETH 200-200-20 MG/5ML PO SUSP
30.0000 mL | Freq: Four times a day (QID) | ORAL | Status: DC | PRN
  Administered 2017-09-01: 30 mL via ORAL
  Filled 2017-09-01: qty 30

## 2017-09-01 MED ORDER — SODIUM CHLORIDE 0.9 % IV BOLUS
500.0000 mL | Freq: Once | INTRAVENOUS | Status: AC
  Administered 2017-09-01: 500 mL via INTRAVENOUS

## 2017-09-01 MED ORDER — SODIUM CHLORIDE 0.9 % IV BOLUS: 1000.0000 mL | Freq: Once | INTRAVENOUS | Status: DC

## 2017-09-01 NOTE — Progress Notes (Signed)
Notified Dr Jannifer Franklin of low blood pressure 84/58. Received order to infuse NS bolus

## 2017-09-01 NOTE — Progress Notes (Signed)
Clinical Social Worker (CSW) met with patient and his wife Jason Cooper and presented bed offers. Patient chose [facility]. [facility] liaison is aware of accepted bed offer. Per [name] patient can D/C to [facility] when medically stable.   [employer], LCSW [phone]

## 2017-09-01 NOTE — Progress Notes (Signed)
Incentive spirometer. Pt  Instructed how to use and explained the benefit of its use

## 2017-09-01 NOTE — Progress Notes (Signed)
Subjective: 1 Day Post-Op Procedure(s) (LRB): INTRAMEDULLARY (IM) NAIL INTERTROCHANTRIC (Right) Patient reports pain as mild.   Patient is well but is having difficulty with hypotension. PT and care management to assist with discharge planning. Negative for chest pain and shortness of breath Fever: no Gastrointestinal:Negative for nausea and vomiting Most recent BP reading was 78/58.  Objective: Vital signs in last 24 hours: Temp:  [96.9 F (36.1 C)-98.9 F (37.2 C)] 97.7 F (36.5 C) (05/31 0436) Pulse Rate:  [76-103] 94 (05/31 0436) Resp:  [13-22] 16 (05/31 0436) BP: (78-146)/(52-85) 78/58 (05/31 0627) SpO2:  [88 %-100 %] 100 % (05/31 0436) Weight:  [92.4 kg (203 lb 11.3 oz)] 92.4 kg (203 lb 11.3 oz) (05/30 1507)  Intake/Output from previous day:  Intake/Output Summary (Last 24 hours) at 09/01/2017 0759 Last data filed at 08/31/2017 2210 Gross per 24 hour  Intake 2791.67 ml  Output 430 ml  Net 2361.67 ml    Intake/Output this shift: No intake/output data recorded.  Labs: Recent Labs    08/30/17 1319 08/31/17 0419 09/01/17 0427  HGB 13.0 11.0* 8.9*   Recent Labs    08/31/17 0419 09/01/17 0427  WBC 3.8 3.0*  RBC 3.61* 2.86*  HCT 33.2* 26.2*  PLT 119* 103*   Recent Labs    08/31/17 0419 09/01/17 0427  NA 137 136  K 4.0 4.7  CL 107 106  CO2 24 22  BUN 17 21*  CREATININE 0.71 0.85  GLUCOSE 119* 120*  CALCIUM 7.9* 7.5*   No results for input(s): LABPT, INR in the last 72 hours.   EXAM General - Patient is Alert, Appropriate and Oriented Extremity - ABD soft Sensation intact distally Intact pulses distally Dorsiflexion/Plantar flexion intact Incision: dressing C/D/I No cellulitis present Dressing/Incision - clean, dry, no drainage Motor Function - intact, moving foot and toes well on exam. Abdomen soft with normal BS this morning.  Past Medical History:  Diagnosis Date  . Acute pulmonary embolism ([place]) 11/28/2015   Incidentally found on CT  11/2015, started lovenox  . Cluster headache    as 72 yo, no more since retired (25 yrs ago)  . Coronary artery disease   . DVT (deep venous thrombosis) ([place])    2018  . Environmental allergies   . H/O malaria 1969   during [place] (hospitalized in [place])  . H/O varicella   . History of chicken pox   . HLD (hyperlipidemia)   . HTN (hypertension)   . Malignant neoplasm of lower third of esophagus ([place]) 09/15/2015   Followed at [facility] by rad onc and onc   . Osteoarthritis    s/p bilat knee replacements  . Prediabetes 2007  . Seasonal allergic rhinitis   . Seizures ([facility])     Assessment/Plan: 1 Day Post-Op Procedure(s) (LRB): INTRAMEDULLARY (IM) NAIL INTERTROCHANTRIC (Right) Active Problems:   Hip fracture ([facility])   Malnutrition of moderate degree  Estimated body mass index is 25.46 kg/m as calculated from the following:   Height as of this encounter: 6\' 3"  (1.905 m).   Weight as of this encounter: 92.4 kg (203 lb 11.3 oz). Advance diet Up with therapy   Labs reviewed this AM. Hg 8.9, HCT 26.2.  CBC and BMP ordered for tomorrow morning. Hypotension, BP 78/58.  Has received 3 NS bolus' at this time.  Continue to monitor. Up with therapy today today.  DVT Prophylaxis - Xarelto, Foot Pumps and TED hose Weight-Bearing as tolerated to right leg  J. Cameron Proud, PA-C [facility] 09/01/2017, 7:59 AM

## 2017-09-01 NOTE — Progress Notes (Signed)
Blood Pressure 78/58 manually. Dr Jannifer Franklin notified. Received new order

## 2017-09-01 NOTE — Progress Notes (Signed)
Temp 101.5 after receiving tylenol.  Temp 101.2 prior to tylenol. Dr Jannifer Franklin notified. Received order for ibuprofen

## 2017-09-01 NOTE — Progress Notes (Signed)
PT Cancellation Note  Patient Details Name: Jason Cooper MRN: [id number] DOB: 12-15-1945   Cancelled Treatment:    Reason Eval/Treat Not Completed: Other (comment) Spoke with daughter who states that he is sleeping, that his BP just finally has come up and that they do not want to interrupt him at this time, requests to start PT tomorrow.  Pt had a lot of pain with a few minimal exercises this AM, otherwise he has not done much.  Will need to try to walk and sit up in recliner tomorrow.  Kreg Shropshire, DPT 09/01/2017, 4:44 PM

## 2017-09-01 NOTE — Progress Notes (Signed)
PT Cancellation Note  Patient Details Name: Jason Cooper MRN: [id number] DOB: [birth year]   Cancelled Treatment:    Reason Eval/Treat Not Completed: Other (comment) Pt interested in working with PT but after gathering some background information and attempting a few light, minimal movement exercises with R LE and pt had too much pain.  Pt has had very low BP since sx and therefore has had limited pain med regime.  Kreg Shropshire, DPT 09/01/2017, 1:00 PM

## 2017-09-01 NOTE — Progress Notes (Signed)
Patient does not want the magic mouthwash & Lidocaine rinses.

## 2017-09-01 NOTE — Progress Notes (Signed)
Spoke to Dr. Fritzi Mandes regarding patient's urine retention today. Ok to order in/out cath for patient

## 2017-09-01 NOTE — Progress Notes (Signed)
[facility] NAME: Jason Cooper    MR#:  [id number]  DATE OF BIRTH:  01/16/1946  SUBJECTIVE:  CHIEF COMPLAINT:   Chief Complaint  Patient presents with  . Hip Pain  Patient without complaint, wife at the bedside, noted hypotension which is persistent throughout the night-improved this morning with fluid rehydration  REVIEW OF SYSTEMS:  CONSTITUTIONAL: No fever, fatigue or weakness.  EYES: No blurred or double vision.  EARS, NOSE, AND THROAT: No tinnitus or ear pain.  RESPIRATORY: No cough, shortness of breath, wheezing or hemoptysis.  CARDIOVASCULAR: No chest pain, orthopnea, edema.  GASTROINTESTINAL: No nausea, vomiting, diarrhea or abdominal pain.  GENITOURINARY: No dysuria, hematuria.  ENDOCRINE: No polyuria, nocturia,  HEMATOLOGY: No anemia, easy bruising or bleeding SKIN: No rash or lesion. MUSCULOSKELETAL: No joint pain or arthritis.   NEUROLOGIC: No tingling, numbness, weakness.  PSYCHIATRY: No anxiety or depression.   ROS  DRUG ALLERGIES:  No Known Allergies  VITALS:  Blood pressure (!) 94/59, pulse (!) 101, temperature 97.9 F (36.6 C), resp. rate 18, height 6\' 3"  (1.905 m), weight 92.4 kg (203 lb 11.3 oz), SpO2 92 %.  PHYSICAL EXAMINATION:  GENERAL:  72 y.o.-year-old patient lying in the bed with no acute distress.  EYES: Pupils equal, round, reactive to light and accommodation. No scleral icterus. Extraocular muscles intact.  HEENT: Head atraumatic, normocephalic. Oropharynx and nasopharynx clear.  NECK:  Supple, no jugular venous distention. No thyroid enlargement, no tenderness.  LUNGS: Normal breath sounds bilaterally, no wheezing, rales,rhonchi or crepitation. No use of accessory muscles of respiration.  CARDIOVASCULAR: S1, S2 normal. No murmurs, rubs, or gallops.  ABDOMEN: Soft, nontender, nondistended. Bowel sounds present. No organomegaly or mass.  EXTREMITIES: No pedal edema, cyanosis, or clubbing.   NEUROLOGIC: Cranial nerves II through XII are intact. Muscle strength 5/5 in all extremities. Sensation intact. Gait not checked.  PSYCHIATRIC: The patient is alert and oriented x 3.  SKIN: No obvious rash, lesion, or ulcer.   Physical Exam LABORATORY PANEL:   CBC Recent Labs  Lab 09/01/17 0427  WBC 3.0*  HGB 8.9*  HCT 26.2*  PLT 103*   ------------------------------------------------------------------------------------------------------------------  Chemistries  Recent Labs  Lab 08/30/17 1319  09/01/17 0427  NA 137   < > 136  K 4.0   < > 4.7  CL 105   < > 106  CO2 19*   < > 22  GLUCOSE 145*   < > 120*  BUN 15   < > 21*  CREATININE 0.94   < > 0.85  CALCIUM 8.7*   < > 7.5*  AST 55*  --   --   ALT 45  --   --   ALKPHOS 155*  --   --   BILITOT 0.7  --   --    < > = values in this interval not displayed.   ------------------------------------------------------------------------------------------------------------------  Cardiac Enzymes No results for input(s): TROPONINI in the last 168 hours. ------------------------------------------------------------------------------------------------------------------  RADIOLOGY:  Dg Chest 1 View  Result Date: 08/30/2017 CLINICAL DATA:  Status post fall. EXAM: CHEST  1 VIEW COMPARISON:  09/15/2015 FINDINGS: Right-sided Port-A-Cath with the tip projecting over the cavoatrial junction. There is no focal parenchymal opacity. There is no pleural effusion or pneumothorax. The heart and mediastinal contours are unremarkable. The osseous structures are unremarkable. IMPRESSION: No active disease. Electronically Signed   By: Kathreen Devoid   On: 08/30/2017 14:16   Dg Hip Operative Kaylyn Layer W  Or W/o Pelvis Right  Result Date: 08/31/2017 CLINICAL DATA:  72 y/o  M; right hip fracture repair. EXAM: RIGHT FEMUR PORTABLE 2 VIEW; OPERATIVE RIGHT HIP WITH PELVIS COMPARISON:  08/30/2017 right lower extremity radiographs. FINDINGS: Intraoperative  fluoroscopy and radiographs of the right hip and femur. Fluoro time is 2 minutes and 18 seconds. Right intramedullary nail placement, proximal lag screw, and 2 distal interlocking screws. Improved alignment post fixation. Persistent mild displacement of lesser trochanter. Skin staples project over the lateral hip and lateral to the knee. Total knee arthroplasty noted. No new fracture or dislocation identified. IMPRESSION: Intraoperative fluoroscopy and radiographs of a right femur intramedullary nail placement. Improved alignment post fixation. No new fracture or dislocation. Electronically Signed   By: Kristine Garbe M.D.   On: 08/31/2017 22:17   Dg Hip Unilat  With Pelvis 2-3 Views Right  Result Date: 08/30/2017 CLINICAL DATA:  Right hip pain after fall today. EXAM: DG HIP (WITH OR WITHOUT PELVIS) 2-3V RIGHT COMPARISON:  None. FINDINGS: Severely comminuted and displaced fracture is seen involving the intertrochanteric region of the proximal right femur which extends into proximal femoral shaft. Femoral head remains situated within the acetabulum. IMPRESSION: Severely comminuted and displaced intertrochanteric fracture of proximal right femur which extends into proximal femoral shaft. Electronically Signed   By: Marijo Conception, M.D.   On: 08/30/2017 14:16   Dg Femur Port, Min 2 Views Right  Result Date: 08/31/2017 CLINICAL DATA:  72 y/o  M; right hip fracture repair. EXAM: RIGHT FEMUR PORTABLE 2 VIEW; OPERATIVE RIGHT HIP WITH PELVIS COMPARISON:  08/30/2017 right lower extremity radiographs. FINDINGS: Intraoperative fluoroscopy and radiographs of the right hip and femur. Fluoro time is 2 minutes and 18 seconds. Right intramedullary nail placement, proximal lag screw, and 2 distal interlocking screws. Improved alignment post fixation. Persistent mild displacement of lesser trochanter. Skin staples project over the lateral hip and lateral to the knee. Total knee arthroplasty noted. No new fracture  or dislocation identified. IMPRESSION: Intraoperative fluoroscopy and radiographs of a right femur intramedullary nail placement. Improved alignment post fixation. No new fracture or dislocation. Electronically Signed   By: Kristine Garbe M.D.   On: 08/31/2017 22:17    ASSESSMENT AND PLAN:  *Acute hypotension Noted chronic Decadron use chronically,?  Due to adrenal insufficiency Discontinue Decadron, start hydrocortisone 3 times daily, check a.m. cortisol now, increase IV fluids, liter bolus now, CBC/BMP in the morning, and continue close medical monitoring as patient may require transfer to stepdown unit for pressor support  *Acute right hip fracture Status post mechanical fall S/P ORIF on Aug 31, 2017 by Dr. 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 Management plans discussed with the patient, family and they are in agreement.  CODE STATUS: partial  TOTAL TIME TAKING CARE OF THIS PATIENT: 35 minutes.     POSSIBLE D/C IN 1-3 DAYS, DEPENDING ON CLINICAL CONDITION.   Avel Peace Salary M.D on 09/01/2017   Between 7am to 6pm - Pager - [phone]  After 6pm go to [email or web address] - password [email or web address] [facility]  Office  [phone]  CC: Primary care physician; Ria Bush, MD  Note: This dictation was prepared with Dragon dictation along with smaller phrase technology. Any transcriptional errors that  result from this process are unintentional.

## 2017-09-01 NOTE — Progress Notes (Signed)
Notified Dr Marry Guan of low blood pressure. Received order to infuse 500 cc NS bolus

## 2017-09-01 NOTE — Progress Notes (Signed)
Foley removed. Pt complained of burning sensation

## 2017-09-01 NOTE — Progress Notes (Signed)
Patient was able to void 150 mL on own and wishes to wait a little while before in/out cath. He is hoping he will void again soon.

## 2017-09-01 NOTE — Evaluation (Signed)
Occupational Therapy Evaluation Patient Details Name: Jason Cooper MRN: [id number] DOB: 1945-11-22 Today's Date: 09/01/2017    History of Present Illness Pt. is a 72 y.o. male who was admitted to [facility] for ORIF Repair of a right pertrochanteric Femur Fracture. 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   Clinical Impression   Pt. Was seen for initial visit at bed level secondary to low BP. Pt. Presents with weakness, limited activity tolerance,  and limited functional mobility which limits the ability to complete basic ADL and IADL functioning. Pt. resides at home with his wife. Pt. Was independent with ADLs, and IADL functioning. Pt. was able to drive. Pt. Education was provided about A/E use for LE ADLs. Pt. Reported being familiar with the equipment secondary to having had 2 knee replacements in the past. Pt. Could benefit from OT services for ADL training, A/E training, and pt. Education about home modification, and DME. Pt. would benefitt from SNF level of care upon discharge. Pt. Could benefit from follow-up OT services at discharge.    Follow Up Recommendations  SNF    Equipment Recommendations       Recommendations for Other Services       Precautions / Restrictions Restrictions RLE Weight Bearing: Weight bearing as tolerated      Mobility Bed Mobility               General bed mobility comments: Deferred  Transfers                 General transfer comment: Deferred    Balance                                           ADL either performed or assessed with clinical judgement   ADL Overall ADL's : Needs assistance/impaired Eating/Feeding: Independent;Set up;Bed level   Grooming: Set up;Bed level;Independent   Upper Body Bathing: Bed level;Moderate assistance   Lower Body Bathing: Bed level;Maximal assistance   Upper Body Dressing :  Bed level;Set up;Moderate assistance   Lower Body Dressing: Set up;Bed level;Maximal assistance                 General ADL Comments: Pt. education was provided about A/E use for LE ADLs.     Vision Patient Visual Report: No change from baseline       Perception     Praxis      Pertinent Vitals/Pain Pain Assessment: 0-10 Pain Score: 5  Pain Location: Right Hip Pain Descriptors / Indicators: Aching     Hand Dominance Left   Extremity/Trunk Assessment Upper Extremity Assessment Upper Extremity Assessment: Generalized weakness           Communication Communication Communication: No difficulties   Cognition Arousal/Alertness: Awake/alert Behavior During Therapy: WFL for tasks assessed/performed Overall Cognitive Status: Within Functional Limits for tasks assessed                                     General Comments       Exercises     Shoulder Instructions      Home Living Family/patient expects to be discharged to:: Private residence Living Arrangements: Spouse/significant other   Type of Home: House Home Access: Stairs to enter [employer]  of Steps: 2 in the back   Home Layout: One level     Bathroom Shower/Tub: Walk-in shower;Door         Home Equipment: Walker - 2 wheels;Cane - single point(Reacher)          Prior Functioning/Environment Level of Independence: Independent with assistive device(s)        Comments: Driving        OT Problem List: Decreased strength;Decreased range of motion;Decreased activity tolerance;Impaired balance (sitting and/or standing);Pain;Decreased knowledge of use of DME or AE      OT Treatment/Interventions: Therapeutic exercise;Self-care/ADL training;Patient/family education;Therapeutic activities    OT Goals(Current goals can be found in the care plan section) Acute Rehab OT Goals Patient Stated Goal: To regain independence OT Goal Formulation: With patient  OT  Frequency: Min 2X/week   Barriers to D/C:            Co-evaluation              AM-PAC PT "6 Clicks" Daily Activity     Outcome Measure Help from another person eating meals?: None Help from another person taking care of personal grooming?: A Little Help from another person toileting, which includes using toliet, bedpan, or urinal?: A Lot Help from another person bathing (including washing, rinsing, drying)?: A Lot Help from another person to put on and taking off regular upper body clothing?: A Lot Help from another person to put on and taking off regular lower body clothing?: A Lot 6 Click Score: 15   End of Session    Activity Tolerance: Other (comment)(Low BP) Patient left:    OT Visit Diagnosis: History of falling (Z91.81)                Time: 1015-1030 OT Time Calculation (min): 15 min Charges:  OT General Charges $OT Visit: 1 Visit OT Evaluation $OT Eval Low Complexity: 1 Low G-Codes:     Harrel Carina, MS, OTR/L  Harrel Carina 09/01/2017, 12:38 PM

## 2017-09-01 NOTE — Progress Notes (Signed)
Patient continues to be in 9-10 pain and his bp is still to low to give oxy. Called Dr. Jerelyn Charles for a muscle relaxer and he ordered Skelaxin. Gave to patient and pain relieve was achieved.

## 2017-09-01 NOTE — Care Management Important Message (Signed)
Important Message  Patient Details  Name: Jason Cooper MRN: [id number] Date of Birth: 02-08-1946   Medicare Important Message Given:  Yes    Juliann Pulse A Arthuro Canelo 09/01/2017, 11:24 AM

## 2017-09-02 LAB — CBC
HCT: 24.1 % — ABNORMAL LOW (ref 40.0–52.0)
Hemoglobin: 8.1 g/dL — ABNORMAL LOW (ref 13.0–18.0)
MCH: 30.9 pg (ref 26.0–34.0)
MCHC: 33.6 g/dL (ref 32.0–36.0)
MCV: 92.1 fL (ref 80.0–100.0)
Platelets: 95 10*3/uL — ABNORMAL LOW (ref 150–440)
RBC: 2.62 MIL/uL — AB (ref 4.40–5.90)
RDW: 19.2 % — ABNORMAL HIGH (ref 11.5–14.5)
WBC: 2.9 10*3/uL — ABNORMAL LOW (ref 3.8–10.6)

## 2017-09-02 LAB — BASIC METABOLIC PANEL
Anion gap: 5 (ref 5–15)
BUN: 20 mg/dL (ref 6–20)
CO2: 23 mmol/L (ref 22–32)
Calcium: 7.8 mg/dL — ABNORMAL LOW (ref 8.9–10.3)
Chloride: 110 mmol/L (ref 101–111)
Creatinine, Ser: 0.66 mg/dL (ref 0.61–1.24)
GFR calc Af Amer: >60 mL/min (ref >60)
GFR calc non Af Amer: >60 mL/min (ref >60)
GLUCOSE: 144 mg/dL — AB (ref 65–99)
POTASSIUM: 4.7 mmol/L (ref 3.5–5.1)
SODIUM: 138 mmol/L (ref 135–145)

## 2017-09-02 MED ORDER — TAMSULOSIN HCL 0.4 MG PO CAPS
0.4000 mg | ORAL_CAPSULE | Freq: Every day | ORAL | Status: DC
  Administered 2017-09-02 – 2017-09-03 (×2): 0.4 mg via ORAL
  Filled 2017-09-02 (×2): qty 1

## 2017-09-02 NOTE — Progress Notes (Addendum)
Physical Therapy Treatment Patient Details Name: Jason Cooper MRN: [id number] DOB: 08-Jan-1946 Today's Date: 09/02/2017    History of Present Illness Pt. is a 72 y.o. male who was admitted to [facility] for ORIF Repair of a right pertrochanteric Femur Fracture. 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    PT Comments    Pt was given pain medication ~30 min prior to session. He reported pain at an 8/10 before which remained constant throughout treatment. He does require frequent verbal and tactile cues to use bil UE with transfers as well use of the LLE, but was able to transfer EOB better compared to initial visit this AM but still requires max assist +2. Pt demonstrates increased level of apprehension regarding pain and has increased difficulty relaxing. He was able to stand for longer this session but continues to required +2 max assist, and requires cues for deep breathing and relaxation techniques. Due to apprehension pt may benefit from being followed with consistent staff PT.   Follow Up Recommendations  SNF     Equipment Recommendations  Rolling walker with 5" wheels    Recommendations for Other Services       Precautions / Restrictions Precautions Precautions: Fall Restrictions Weight Bearing Restrictions: Yes RLE Weight Bearing: Weight bearing as tolerated    Mobility  Bed Mobility Overal bed mobility: Needs Assistance Bed Mobility: Supine to Sit;Sit to Supine     Supine to sit: +2 for physical assistance;Max assist Sit to supine: +2 for physical assistance;Max assist   General bed mobility comments: supine to sit required HOB elevated and slow controlled movement to EOB with multiple verbal/ tactile cues required to use bil UE's.  for sit> supine pt required multiple cues for pushing through arms and LLE for assistance with entire bed tilted with hob down to promote ease of  transfer  Transfers Overall transfer level: Needs assistance Equipment used: Rolling walker (2 wheeled) Transfers: Sit to/from Stand Sit to Stand: Max assist;+2 physical assistance         General transfer comment: pt required multiple verbale cues for hand placement pushing one from bed and holding on to walker with other   Ambulation/Gait                 Stairs             Wheelchair Mobility    Modified Rankin (Stroke Patients Only)       Balance Overall balance assessment: Needs assistance Sitting-balance support: Bilateral upper extremity supported   Sitting balance - Comments: required verbal cues for hand placement to promote sitting balance due to pt reaching outside of BOS                                    Cognition Arousal/Alertness: Awake/alert Behavior During Therapy: WFL for tasks assessed/performed Overall Cognitive Status: Within Functional Limits for tasks assessed                                        Exercises Other Exercises Other Exercises: Supine <> sit max 2- max v/c to use arms  Other Exercises: Sit <> stand with walker Max 2 - cueing for breathing- got dizzy  , keep eyes closed Other Exercises: scoot up in bed  Max 2- cueing to kick with L leg     General Comments        Pertinent Vitals/Pain Pain Assessment: 0-10 Pain Score: 8  Pain Location: Right Hip Pain Descriptors / Indicators: Aching Pain Intervention(s): Limited activity within patient's tolerance    Home Living Family/patient expects to be discharged to:: Private residence Living Arrangements: Spouse/significant other Available Help at Discharge: Family Type of Home: House Home Access: Stairs to enter Entrance Stairs-Rails: Right Home Layout: One level Home Equipment: Environmental consultant - 2 wheels;Cane - single point      Prior Function Level of Independence: Independent          PT Goals (current goals can now be found in the  care plan section) Acute Rehab PT Goals Patient Stated Goal: To regain independence, and feel better PT Goal Formulation: With patient/family Time For Goal Achievement: 09/16/17 Potential to Achieve Goals: Good    Frequency    BID      PT Plan      Co-evaluation   Reason for Co-Treatment: For patient/therapist safety;To address functional/ADL transfers   OT goals addressed during session: ADL's and self-care      AM-PAC PT "6 Clicks" Daily Activity  Outcome Measure  Difficulty turning over in bed (including adjusting bedclothes, sheets and blankets)?: Unable Difficulty moving from lying on back to sitting on the side of the bed? : Unable Difficulty sitting down on and standing up from a chair with arms (e.g., wheelchair, bedside commode, etc,.)?: Unable Help needed moving to and from a bed to chair (including a wheelchair)?: Total Help needed walking in hospital room?: Total Help needed climbing 3-5 steps with a railing? : Total 6 Click Score: 6    End of Session Equipment Utilized During Treatment: Gait belt Activity Tolerance: Patient tolerated treatment well;Patient limited by pain;Patient limited by fatigue Patient left: in bed;with call bell/phone within reach;with family/visitor present Nurse Communication: Mobility status;Patient requests pain meds PT Visit Diagnosis: Muscle weakness (generalized) (M62.81);Other abnormalities of gait and mobility (R26.89);Unsteadiness on feet (R26.81);Pain;Difficulty in walking, not elsewhere classified (R26.2);History of falling (Z91.81) Pain - Right/Left: Right Pain - part of body: Hip     Time: 4765-4650 PT Time Calculation (min) (ACUTE ONLY): 26 min  Charges:  $Therapeutic Activity: 8-22 mins                    G Codes:       Avira Tillison PT, DPT, LAT, ATC  09/02/17  1:22 PM        Aashika Carta 09/02/2017, 1:22 PM

## 2017-09-02 NOTE — Progress Notes (Signed)
Occupational Therapy Treatment Patient Details Name: Jason Cooper MRN: [id number] DOB: 28-Sep-1945 Today's Date: 09/02/2017    History of present illness (P) Pt. is a 72 y.o. male who was admitted to [facility] for ORIF Repair of a right pertrochanteric Femur Fracture. 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   OT comments  Pt in bed upon arrival - pt cont to be max 2 for bed mobility and sit<> stand - but appear from other therapy notes was able to help more this date with bilateral arms and L LE - pt cont to show fear for pain and moving - and need max cueing for breathing, rest breaks , keeping eyes open , to talk to OT during sitting and standing - as well as pt do better having information what to expect - or what next move will be -  Pt cont to be limited in ADL's and functional mobility in ADL's secondary to pain , decrease ROM and strength in R LE and decrease balance - would recommend next session to address LB dressing - pt did use AE in past for bilateral TKR   Follow Up Recommendations       Equipment Recommendations       Recommendations for Other Services      Precautions / Restrictions Precautions Precautions: (P) Fall Restrictions Weight Bearing Restrictions: (P) Yes RLE Weight Bearing: (P) Weight bearing as tolerated       Mobility B        Balance                                   ADL either performed or assessed with clinical judgement   ADL                                               Vision       Perception     Praxis      Cognition Arousal/Alertness: (P) Awake/alert Behavior During Therapy: (P) WFL for tasks assessed/performed Overall Cognitive Status: (P) Within Functional Limits for tasks assessed                                          Exercises Exercises: Other exercises Other  Exercises Other Exercises: Supine <> sit max 2- max v/c to use arms  Other Exercises: Sit <> stand with walker Max 2 - cueing for breathing- got dizzy  , keep eyes closed Other Exercises: scoot up in bed Max 2- cueing to kick with L leg    Shoulder Instructions       General Comments      Pertinent Vitals/ Pain       Pain Assessment: 0-10 Pain Score: 8  Pain Location: Right Hip Pain Descriptors / Indicators: Aching Pain Intervention(s): Limited activity within patient's tolerance  Home Living Family/patient expects to be discharged to:: Private residence Living Arrangements: Spouse/significant other Available Help at Discharge: Family Type of Home: House Home Access: Stairs to enter [employer] brewer of Steps: 2 Entrance Stairs-Rails: Right Home Layout: One level     Bathroom Shower/Tub: Walk-in shower;Door(has railings in shower)   Bathroom  Toilet: Standard Bathroom Accessibility: Yes   Home Equipment: Walker - 2 wheels;Cane - single point          Prior Functioning/Environment Level of Independence: Independent            Frequency           Progress Toward Goals  OT Goals(current goals can now be found in the care plan section)  Progress towards OT goals: Progressing toward goals  Acute Rehab OT Goals Patient Stated Goal: (P) To regain independence, and feel better  Plan      Co-evaluation    PT/OT/SLP Co-Evaluation/Treatment: Yes Reason for Co-Treatment: (P) For patient/therapist safety;To address functional/ADL transfers   OT goals addressed during session: ADL's and self-care      AM-PAC PT "6 Clicks" Daily Activity     Outcome Measure                    End of Session Equipment Utilized During Treatment: Gait belt      Activity Tolerance     Patient Left     Nurse Communication          Time: 6389-3734 OT Time Calculation (min): 26 min  Charges: OT Treatments $Therapeutic Activity: 8-22  mins     Albany Winslow OTR/L,CLT 09/02/2017, 1:10 PM

## 2017-09-02 NOTE — Progress Notes (Signed)
Subjective: 2 Days Post-Op Procedure(s) (LRB): INTRAMEDULLARY (IM) NAIL INTERTROCHANTRIC (Right) Patient reports pain as mild, increased with PT. Patient is well, and has had no acute complaints or problems PT and care management to assist with discharge planning. Negative for chest pain and shortness of breath Fever: no Gastrointestinal:Negative for nausea and vomiting Hypotension improved compared to yesterday.  Objective: Vital signs in last 24 hours: Temp:  [97.6 F (36.4 C)-98.4 F (36.9 C)] 97.7 F (36.5 C) (06/01 0817) Pulse Rate:  [78-94] 79 (06/01 0817) Resp:  [18] 18 (06/01 0817) BP: (90-161)/(60-91) 161/91 (06/01 0817) SpO2:  [91 %-98 %] 98 % (06/01 0817)  Intake/Output from previous day:  Intake/Output Summary (Last 24 hours) at 09/02/2017 1019 Last data filed at 09/02/2017 0500 Gross per 24 hour  Intake 4711.58 ml  Output 550 ml  Net 4161.58 ml    Intake/Output this shift: No intake/output data recorded.  Labs: Recent Labs    08/30/17 1319 08/31/17 0419 09/01/17 0427 09/02/17 0603  HGB 13.0 11.0* 8.9* 8.1*   Recent Labs    09/01/17 0427 09/02/17 0603  WBC 3.0* 2.9*  RBC 2.86* 2.62*  HCT 26.2* 24.1*  PLT 103* 95*   Recent Labs    09/01/17 0427 09/02/17 0603  NA 136 138  K 4.7 4.7  CL 106 110  CO2 22 23  BUN 21* 20  CREATININE 0.85 0.66  GLUCOSE 120* 144*  CALCIUM 7.5* 7.8*   No results for input(s): LABPT, INR in the last 72 hours.   EXAM General - Patient is Alert, Appropriate and Oriented Extremity - ABD soft Sensation intact distally Intact pulses distally Dorsiflexion/Plantar flexion intact Incision: dressing C/D/I No cellulitis present Dressing/Incision - clean, dry, no drainage Motor Function - intact, moving foot and toes well on exam. Abdomen soft with normal BS this morning.  Past Medical History:  Diagnosis Date  . Acute pulmonary embolism ([place]) 11/28/2015   Incidentally found on CT 11/2015, started lovenox  .  Cluster headache    as 72 yo, no more since retired (25 yrs ago)  . Coronary artery disease   . DVT (deep venous thrombosis) ([place])    2018  . Environmental allergies   . H/O malaria 1969   during [place] (hospitalized in [place])  . H/O varicella   . History of chicken pox   . HLD (hyperlipidemia)   . HTN (hypertension)   . Malignant neoplasm of lower third of esophagus ([place]) 09/15/2015   Followed at [facility] by rad onc and onc   . Osteoarthritis    s/p bilat knee replacements  . Prediabetes 2007  . Seasonal allergic rhinitis   . Seizures ([facility])     Assessment/Plan: 2 Days Post-Op Procedure(s) (LRB): INTRAMEDULLARY (IM) NAIL INTERTROCHANTRIC (Right) Active Problems:   Hip fracture (H[facility])   Malnutrition of moderate degree  Estimated body mass index is 25.46 kg/m as calculated from the following:   Height as of this encounter: 6\' 3"  (1.905 m).   Weight as of this encounter: 92.4 kg (203 lb 11.3 oz). Advance diet Up with therapy   Labs reviewed this AM. Hg 8.1, HCT 24.1.  CBC and BMP ordered for tomorrow morning. Hypotension has resolved, d/c IVF.  Can proceed with pain medication. Up with therapy today today.  Current plan will be for discharge to SNF when medically appropriate.  DVT Prophylaxis - Xarelto, Foot Pumps and TED hose Weight-Bearing as tolerated to right leg  J. Cameron Proud, PA-C [facility] 09/02/2017, 10:19 AM

## 2017-09-02 NOTE — Progress Notes (Signed)
Patient voided on own 4oomL

## 2017-09-02 NOTE — Progress Notes (Signed)
[facility] NAME: Jason Cooper    MR#:  [id number]  DATE OF BIRTH:  06-23-1945  SUBJECTIVE:  CHIEF COMPLAINT:   Chief Complaint  Patient presents with  . Hip Pain  Patient has had issues with urinary retention requiring in and out catheterization Blood pressure is now normalized  REVIEW OF SYSTEMS:  CONSTITUTIONAL: No fever, fatigue or weakness.  EYES: No blurred or double vision.  EARS, NOSE, AND THROAT: No tinnitus or ear pain.  RESPIRATORY: No cough, shortness of breath, wheezing or hemoptysis.  CARDIOVASCULAR: No chest pain, orthopnea, edema.  GASTROINTESTINAL: No nausea, vomiting, diarrhea or abdominal pain.  GENITOURINARY: No dysuria, hematuria.  ENDOCRINE: No polyuria, nocturia,  HEMATOLOGY: No anemia, easy bruising or bleeding SKIN: No rash or lesion. MUSCULOSKELETAL: No joint pain or arthritis.   NEUROLOGIC: No tingling, numbness, weakness.  PSYCHIATRY: No anxiety or depression.   ROS  DRUG ALLERGIES:  No Known Allergies  VITALS:  Blood pressure (!) 161/91, pulse 79, temperature 97.7 F (36.5 C), resp. rate 18, height 6\' 3"  (1.905 m), weight 92.4 kg (203 lb 11.3 oz), SpO2 98 %.  PHYSICAL EXAMINATION:  GENERAL:  72 y.o.-year-old patient lying in the bed with no acute distress.  EYES: Pupils equal, round, reactive to light and accommodation. No scleral icterus. Extraocular muscles intact.  HEENT: Head atraumatic, normocephalic. Oropharynx and nasopharynx clear.  NECK:  Supple, no jugular venous distention. No thyroid enlargement, no tenderness.  LUNGS: Normal breath sounds bilaterally, no wheezing, rales,rhonchi or crepitation. No use of accessory muscles of respiration.  CARDIOVASCULAR: S1, S2 normal. No murmurs, rubs, or gallops.  ABDOMEN: Soft, nontender, nondistended. Bowel sounds present. No organomegaly or mass.  EXTREMITIES: No pedal edema, cyanosis, or clubbing.  NEUROLOGIC: Cranial nerves II through  XII are intact. Muscle strength 5/5 in all extremities. Sensation intact. Gait not checked.  PSYCHIATRIC: The patient is alert and oriented x 3.  SKIN: No obvious rash, lesion, or ulcer.   Physical Exam LABORATORY PANEL:   CBC Recent Labs  Lab 09/02/17 0603  WBC 2.9*  HGB 8.1*  HCT 24.1*  PLT 95*   ------------------------------------------------------------------------------------------------------------------  Chemistries  Recent Labs  Lab 08/30/17 1319  09/02/17 0603  NA 137   < > 138  K 4.0   < > 4.7  CL 105   < > 110  CO2 19*   < > 23  GLUCOSE 145*   < > 144*  BUN 15   < > 20  CREATININE 0.94   < > 0.66  CALCIUM 8.7*   < > 7.8*  AST 55*  --   --   ALT 45  --   --   ALKPHOS 155*  --   --   BILITOT 0.7  --   --    < > = values in this interval not displayed.   ------------------------------------------------------------------------------------------------------------------  Cardiac Enzymes No results for input(s): TROPONINI in the last 168 hours. ------------------------------------------------------------------------------------------------------------------  RADIOLOGY:  Dg Hip Operative Unilat W Or W/o Pelvis Right  Result Date: 08/31/2017 CLINICAL DATA:  72 y/o  M; right hip fracture repair. EXAM: RIGHT FEMUR PORTABLE 2 VIEW; OPERATIVE RIGHT HIP WITH PELVIS COMPARISON:  08/30/2017 right lower extremity radiographs. FINDINGS: Intraoperative fluoroscopy and radiographs of the right hip and femur. Fluoro time is 2 minutes and 18 seconds. Right intramedullary nail placement, proximal lag screw, and 2 distal interlocking screws. Improved alignment post fixation. Persistent mild displacement of lesser trochanter. Skin staples project  over the lateral hip and lateral to the knee. Total knee arthroplasty noted. No new fracture or dislocation identified. IMPRESSION: Intraoperative fluoroscopy and radiographs of a right femur intramedullary nail placement. Improved alignment  post fixation. No new fracture or dislocation. Electronically Signed   By: Kristine Garbe M.D.   On: 08/31/2017 22:17   Dg Femur [place] 2 Views Right  Result Date: 08/31/2017 CLINICAL DATA:  72 y/o  M; right hip fracture repair. EXAM: RIGHT FEMUR PORTABLE 2 VIEW; OPERATIVE RIGHT HIP WITH PELVIS COMPARISON:  08/30/2017 right lower extremity radiographs. FINDINGS: Intraoperative fluoroscopy and radiographs of the right hip and femur. Fluoro time is 2 minutes and 18 seconds. Right intramedullary nail placement, proximal lag screw, and 2 distal interlocking screws. Improved alignment post fixation. Persistent mild displacement of lesser trochanter. Skin staples project over the lateral hip and lateral to the knee. Total knee arthroplasty noted. No new fracture or dislocation identified. IMPRESSION: Intraoperative fluoroscopy and radiographs of a right femur intramedullary nail placement. Improved alignment post fixation. No new fracture or dislocation. Electronically Signed   By: Kristine Garbe M.D.   On: 08/31/2017 22:17    ASSESSMENT AND PLAN:  *Acute hypotension Patient's blood pressure is stable  *Urinary retention requiring in and out cath Start Flomax Place a Foley Will need to continue Foley at the skilled nursing facility  *Acute right hip fracture Status post mechanical fall S/P ORIF on Aug 31, 2017 by Dr. Hooten/orthopedic surgery  *Acute Metastatic esophageal cancer to the liver Stable  Will need to continue to follow-up with his Oncology s/p discharge for continued medical management/care  *History of PE/DVT Noted history of cancer above Xarelto restarted   *Chronic benign essential hypertension Stable on current regiment  *Chronic GERD  PPI daily  *One seizure  Stable    Disposition to skilled nursing facility/inpatient rehab in 1 to 3 days   All the records are reviewed and case discussed with Care Management/Social Workerr. Management  plans discussed with the patient, family and they are in agreement.  CODE STATUS: partial  TOTAL TIME TAKING CARE OF THIS PATIENT: 35 minutes.     POSSIBLE D/C IN 1-3 DAYS, DEPENDING ON CLINICAL CONDITION.   Dustin Flock M.D on 09/02/2017   Between 7am to 6pm - Pager - [phone]  After 6pm go to [email or web address] - password [email or web address] [facility]  Office  [phone]  CC: Primary care physician; Ria Bush, MD  Note: This dictation was prepared with Dragon dictation along with smaller phrase technology. Any transcriptional errors that result from this process are unintentional.

## 2017-09-02 NOTE — Progress Notes (Addendum)
Patient's daughter has refused suppository or fleet enema for patient. She states that she "wants to give him a chance to go on his own." Since he has had only 1 dose of Flomax she would like [place] to hold off on the foley as well.

## 2017-09-02 NOTE — Evaluation (Addendum)
Physical Therapy Evaluation Patient Details Name: Jason Cooper MRN: [id number] DOB: [birth year] Today's Date: 09/02/2017   History of Present Illness  Pt. is a 72 y.o. male who was admitted to [facility] for ORIF Repair of a right pertrochanteric Femur Fracture. 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  Clinical Impression  Family at bedside when PT entered the room. Pt is A &O x 4 and currently denies pain. Pt demonstrates functional strength int he LLE but unable to test RLE due to pain. UE's deferred to OT evaluation. Pt requires Max assist with frequent cues for supine to sit on edge of bed taking frequent breaks due to pain. Sitting on EOB he had required verbal cues for hand placement to maintain seated balance. Once on EOB pt reported pain increased to 4-5/10. Worked on sit to stand 4 x rasing bed each time to promote ease, and blocking RLE to promote stability. He was able to stand requiring max assist +1 reporting pain at a 9/10. He returned to bed going from sit > supine he required +2 Max assist with frequent verbal and tactile cues for proper hand placement and form and demonstrated limited effort at that time.Given his amount of pain and lift effort he will likely require +2 for treatment. He would benefit from physical therapy to increase R hip strength, promote weight bearing as tolerated, promote mobility and maximize his function. [facility] and would benefit from continued physical therapy upon discharge.     Follow Up Recommendations SNF    Equipment Recommendations  Rolling walker with 5" wheels    Recommendations for Other Services       Precautions / Restrictions Precautions Precautions: Fall Restrictions Weight Bearing Restrictions: Yes RLE Weight Bearing: Weight bearing as tolerated      Mobility  Bed Mobility Overal bed mobility:  Needs Assistance Bed Mobility: Supine to Sit;Sit to Supine     Supine to sit: +2 for physical assistance;Max assist Sit to supine: +2 for physical assistance;Max assist   General bed mobility comments: frequent verbal cues required for moving legs off EOB  and sitting on EOB.   going fomr supine to sit required increased verbal and tactile assist due to pt not responding to cues and required +2 getting into bed and sliding him toward HOB.   Transfers Overall transfer level: Needs assistance               General transfer comment: attempted but unable to perform due to pt unable to stand and put weight through leg wihtout max assist   Ambulation/Gait                Stairs            Wheelchair Mobility    Modified Rankin (Stroke Patients Only)       Balance Overall balance assessment: Needs assistance Sitting-balance support: Bilateral upper extremity supported   Sitting balance - Comments: required verbal cues for hand placement to promote sitting balance due to pt reaching outside of BOS                                     Pertinent Vitals/Pain Pain Assessment: 0-10 Pain Score: 0-No pain(increased to 10/10 during session) Pain Location: Right Hip Pain Descriptors / Indicators: Aching Pain Intervention(s): Limited activity within  patient's tolerance    Home Living Family/patient expects to be discharged to:: Private residence Living Arrangements: Spouse/significant other Available Help at Discharge: Family Type of Home: House Home Access: Stairs to enter Entrance Stairs-Rails: Right Entrance Stairs-Number of Steps: 2 Home Layout: One level Home Equipment: [place] - 2 wheels;Cane - single point      Prior Function Level of Independence: Independent               Hand Dominance   Dominant Hand: Left    Extremity/Trunk Assessment   Upper Extremity Assessment Upper Extremity Assessment: Overall WFL for tasks assessed     Lower Extremity Assessment Lower Extremity Assessment: RLE deficits/detail;LLE deficits/detail RLE: Unable to fully assess due to pain LLE Deficits / Details: 4-/5 in all planes with pain noted in the R hip during testing of the abd/ adduction        Communication   Communication: No difficulties  Cognition Arousal/Alertness: Awake/alert Behavior During Therapy: WFL for tasks assessed/performed Overall Cognitive Status: Within Functional Limits for tasks assessed                                        General Comments      Exercises Other Exercises Other Exercises: standing from EOB 4 x. each time raising the bed higher to promote ease of transition. standing last time for 1 min. (pt required multiple cues for hand placement and to stand up straight)   Assessment/Plan    PT Assessment Patient needs continued PT services  PT Problem List Decreased strength;Decreased range of motion;Decreased activity tolerance;Decreased balance;Decreased mobility;Decreased knowledge of use of DME;Decreased safety awareness;Pain       PT Treatment Interventions Gait training;Stair training;DME instruction;Therapeutic activities;Therapeutic exercise;Balance training;Neuromuscular re-education;Patient/family education;Manual techniques;Functional mobility training    PT Goals (Current goals can be found in the Care Plan section)  Acute Rehab PT Goals Patient Stated Goal: To regain independence, and feel better PT Goal Formulation: With patient/family Time For Goal Achievement: 09/16/17 Potential to Achieve Goals: Good    Frequency BID   Barriers to discharge Other (comment)(pt current limited mobiilty status)      Co-evaluation               AM-PAC PT "6 Clicks" Daily Activity  Outcome Measure Difficulty turning over in bed (including adjusting bedclothes, sheets and blankets)?: Unable Difficulty moving from lying on back to sitting on the side of the bed? :  Unable Difficulty sitting down on and standing up from a chair with arms (e.g., wheelchair, bedside commode, etc,.)?: Unable Help needed moving to and from a bed to chair (including a wheelchair)?: Total Help needed walking in hospital room?: Total Help needed climbing 3-5 steps with a railing? : Total 6 Click Score: 6    End of Session Equipment Utilized During Treatment: Gait belt Activity Tolerance: Patient tolerated treatment well;Patient limited by pain;Patient limited by fatigue Patient left: in bed;with call bell/phone within reach;with family/visitor present Nurse Communication: Mobility status;Patient requests pain meds PT Visit Diagnosis: Muscle weakness (generalized) (M62.81);Other abnormalities of gait and mobility (R26.89);Unsteadiness on feet (R26.81);Pain;Difficulty in walking, not elsewhere classified (R26.2);History of falling (Z91.81) Pain - Right/Left: Right Pain - part of body: Hip    Time: 1062-6948 PT Time Calculation (min) (ACUTE ONLY): 44 min   Charges:   PT Evaluation $PT Eval Moderate Complexity: 1 Mod PT Treatments $Therapeutic Activity: 8-22 mins   PT  G Codes:        Starr Lake PT, DPT, LAT, ATC  09/02/17  11:05 AM       Starr Lake 09/02/2017, 10:59 AM

## 2017-09-03 LAB — BASIC METABOLIC PANEL WITH GFR
Anion gap: 4 — ABNORMAL LOW (ref 5–15)
BUN: 20 mg/dL (ref 6–20)
CO2: 23 mmol/L (ref 22–32)
Calcium: 8.2 mg/dL — ABNORMAL LOW (ref 8.9–10.3)
Chloride: 111 mmol/L (ref 101–111)
Creatinine, Ser: 0.72 mg/dL (ref 0.61–1.24)
GFR calc Af Amer: >60 mL/min
GFR calc non Af Amer: >60 mL/min
Glucose, Bld: 156 mg/dL — ABNORMAL HIGH (ref 65–99)
Potassium: 5 mmol/L (ref 3.5–5.1)
Sodium: 138 mmol/L (ref 135–145)

## 2017-09-03 LAB — CBC
HEMATOCRIT: 24.7 % — AB (ref 40.0–52.0)
HEMOGLOBIN: 8.2 g/dL — AB (ref 13.0–18.0)
MCH: 31 pg (ref 26.0–34.0)
MCHC: 33.3 g/dL (ref 32.0–36.0)
MCV: 92.9 fL (ref 80.0–100.0)
Platelets: 123 10*3/uL — ABNORMAL LOW (ref 150–440)
RBC: 2.66 MIL/uL — AB (ref 4.40–5.90)
RDW: 19.2 % — ABNORMAL HIGH (ref 11.5–14.5)
WBC: 2.8 10*3/uL — AB (ref 3.8–10.6)

## 2017-09-03 MED ORDER — ALUM & MAG HYDROXIDE-SIMETH 200-200-20 MG/5ML PO SUSP: 30.0000 mL | Freq: Four times a day (QID) | ORAL | 0 refills | Status: AC | PRN

## 2017-09-03 MED ORDER — OXYCODONE HCL 10 MG PO TABS: 10.0000 mg | ORAL_TABLET | ORAL | 0 refills | Status: AC | PRN

## 2017-09-03 MED ORDER — ACETAMINOPHEN 325 MG PO TABS: 325.0000 mg | ORAL_TABLET | Freq: Four times a day (QID) | ORAL | Status: AC | PRN

## 2017-09-03 MED ORDER — MAGNESIUM CITRATE PO SOLN
0.5000 | Freq: Once | ORAL | Status: DC
  Filled 2017-09-03: qty 296

## 2017-09-03 MED ORDER — DOCUSATE SODIUM 100 MG PO CAPS: 100.0000 mg | ORAL_CAPSULE | Freq: Two times a day (BID) | ORAL | 0 refills | Status: AC

## 2017-09-03 MED ORDER — LIDOCAINE VISCOUS HCL 2 % MT SOLN: 1.5000 mL | Freq: Four times a day (QID) | OROMUCOSAL | 0 refills | Status: AC | PRN

## 2017-09-03 MED ORDER — TAMSULOSIN HCL 0.4 MG PO CAPS: 0.4000 mg | ORAL_CAPSULE | Freq: Every day | ORAL | Status: AC

## 2017-09-03 MED ORDER — POLYETHYLENE GLYCOL 3350 17 G PO PACK: 17.0000 g | PACK | Freq: Every day | ORAL | Status: DC

## 2017-09-03 NOTE — Clinical Social Work Note (Signed)
The patient will discharge today to [facility] Room 604 via non-emergent EMS. The patient and his wife are aware as is [facility], and all are in agreement. The CSW has sent all documentation to [facility], and the packet has been delivered to the patient's chart including his pink MOST form and hard scripts. The CSW is signing off. Please consult should additional needs arise.  Santiago Bumpers, MSW, Latanya Presser [phone]

## 2017-09-03 NOTE — Progress Notes (Signed)
MD alerted to lack of urination, bladder scanned at 641mls. Ok to in and cath prn. Also stated to alert SNF of order to in and out cath prn. Pt finally urinated 425 in urinal, cathed to emptied bladder prior to transport. Pt refused mag citrate, miralax, however accepted supp and finally enema. Called report to [place] [facility], answered all questions.

## 2017-09-03 NOTE — Care Management Important Message (Signed)
Important Message  Patient Details  Name: Jason Cooper MRN: [id number] Date of Birth: 11-Jul-1945   Medicare Important Message Given:  Yes    Tatelyn Vanhecke A, RN 09/03/2017, 2:03 PM

## 2017-09-03 NOTE — Discharge Summary (Addendum)
[place] [facility] [place], 72 y.o., DOB [birth year], MRN [id number]. 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 male with a known history recurrent metastatic esophageal cancer.  He was unloading his truck today he lost his balance and fell on his right hip.  Patient started having pain and was brought to the ER.  He was noted to have right-sided hip fracture for which he underwent repair.  He tolerated the procedure without any complications.  Post op patient developed hypotension and had to receive IV fluid bolus.  Since he is on chronic steroids he received IV steroids during hospitalization.  Blood pressure now is actually elevated.  Patient also developed urinary retention requiring temporary in and out cath.  Now he is able to urinate on his own.  Continue Flomax for 5 more days  Follow-up with orthopedics in 2 weeks with staple removals at that time              Consults  orthopedic surgery  Significant Tests:  See full reports for all details     Dg Chest 1 View  Result Date: 08/30/2017 CLINICAL DATA:  Status post fall. EXAM: CHEST  1 VIEW COMPARISON:  09/15/2015 FINDINGS: Right-sided Port-A-Cath with the tip projecting over the cavoatrial junction. There is no focal parenchymal opacity. There is no pleural effusion or pneumothorax. The heart and mediastinal contours are unremarkable. The osseous structures are  unremarkable. IMPRESSION: No active disease. Electronically Signed   By: Kathreen Devoid   On: 08/30/2017 14:16   Dg Hip Operative Unilat W Or W/o Pelvis Right  Result Date: 08/31/2017 CLINICAL DATA:  72 y/o  M; right hip fracture repair. EXAM: RIGHT FEMUR PORTABLE 2 VIEW; OPERATIVE RIGHT HIP WITH PELVIS COMPARISON:  08/30/2017 right lower extremity radiographs. FINDINGS: Intraoperative fluoroscopy and radiographs of the right hip and femur. Fluoro time is 2 minutes and 18 seconds. Right intramedullary nail placement, proximal lag screw, and 2 distal interlocking screws. Improved alignment post fixation. Persistent mild displacement of lesser trochanter. Skin staples project over the lateral hip and lateral to the knee. Total knee arthroplasty noted. No new fracture or dislocation identified. IMPRESSION: Intraoperative fluoroscopy and radiographs of a right femur intramedullary nail placement. Improved alignment post fixation. No new fracture or dislocation. Electronically Signed   By: Kristine Garbe M.D.   On: 08/31/2017 22:17   Dg Hip Unilat  With Pelvis 2-3 Views Right  Result Date: 08/30/2017 CLINICAL DATA:  Right hip pain after fall today. EXAM: DG HIP (WITH OR WITHOUT PELVIS) 2-3V RIGHT COMPARISON:  None. FINDINGS: Severely comminuted and displaced fracture is seen involving the intertrochanteric region of the proximal right femur which extends into proximal femoral shaft. Femoral head remains situated within the acetabulum. IMPRESSION: Severely comminuted and displaced intertrochanteric fracture of proximal right femur which extends into proximal femoral shaft. Electronically Signed   By: Marijo Conception, M.D.   On: 08/30/2017 14:16   Dg Femur Port, Min 2 Views Right  Result Date: 08/31/2017 CLINICAL DATA:  72 y/o  M; right hip fracture repair. EXAM: RIGHT FEMUR PORTABLE 2 VIEW; OPERATIVE RIGHT HIP WITH PELVIS COMPARISON:  08/30/2017 right lower extremity radiographs. FINDINGS:  Intraoperative fluoroscopy and radiographs of the right hip and femur. Fluoro time is 2 minutes and 18 seconds. Right intramedullary nail placement, proximal lag screw, and 2 distal interlocking screws. Improved alignment post fixation. Persistent mild displacement of lesser trochanter. Skin staples project over the lateral hip and lateral to the knee. Total knee arthroplasty noted. No new fracture or dislocation identified. IMPRESSION: Intraoperative fluoroscopy and radiographs of a right femur intramedullary nail placement. Improved alignment post fixation. No new fracture or dislocation. Electronically Signed   By: Kristine Garbe M.D.   On: 08/31/2017 22:17       Today   Subjective:   Jason Cooper patient doing better waiting for bowel movement Objective:   Blood pressure (!) 157/92, pulse 95, temperature 98 F (36.7 C), resp. rate 16, height 6\' 3"  (1.905 m), weight 92.4 kg (203 lb 11.3 oz), SpO2 95 %.  .  Intake/Output Summary (Last 24 hours) at 09/03/2017 1004 Last data filed at 09/02/2017 1500 Gross per 24 hour  Intake 460 ml  Output 400 ml  Net 60 ml    Exam VITAL SIGNS: Blood pressure (!) 157/92, pulse 95, temperature 98 F (36.7 C), resp. rate 16, height 6\' 3"  (1.905 m), weight 92.4 kg (203 lb 11.3 oz), SpO2 95 %.  GENERAL:  72 y.o.-year-old patient lying in the bed with no acute distress.  EYES: Pupils equal, round, reactive to light and accommodation. No scleral icterus. Extraocular muscles intact.  HEENT: Head atraumatic, normocephalic. Oropharynx and nasopharynx clear.  NECK:  Supple, no jugular venous distention. No thyroid enlargement, no tenderness.  LUNGS: Normal breath sounds bilaterally, no wheezing, rales,rhonchi or crepitation. No use of accessory muscles of respiration.  CARDIOVASCULAR: S1, S2 normal. No murmurs, rubs, or gallops.  ABDOMEN: Soft, nontender, nondistended. Bowel sounds present. No organomegaly or mass.  EXTREMITIES: No pedal edema,  cyanosis, or clubbing.  NEUROLOGIC: Cranial nerves II through XII are intact. Muscle strength 5/5 in all extremities. Sensation intact. Gait not checked.  PSYCHIATRIC: The patient is alert and oriented x 3.  SKIN: No obvious rash, lesion, or ulcer.   Data Review     CBC w Diff:  Lab Results  Component Value Date   WBC 2.8 (L) 09/03/2017   HGB 8.2 (L) 09/03/2017   HGB 12.1 07/11/2016   HCT 24.7 (L) 09/03/2017   HCT 38 07/11/2016   PLT 123 (L) 09/03/2017   PLT 137 (L) 02/22/2013   LYMPHOPCT 8 08/30/2017   MONOPCT 10 08/30/2017   EOSPCT 1 08/30/2017   BASOPCT 1 08/30/2017   CMP:  Lab Results  Component Value Date   NA 138 09/03/2017   NA 139 07/11/2016   K 5.0 09/03/2017   K 4.1 07/11/2016   CL 111 09/03/2017   CL 107 02/22/2013   CO2 23 09/03/2017   CO2 30 02/22/2013   BUN 20 09/03/2017   BUN 13 02/22/2013   CREATININE 0.72 09/03/2017   CREATININE 1.1 07/11/2016   PROT 6.0 (L) 08/30/2017   ALBUMIN 2.8 (L) 08/30/2017   BILITOT 0.7 08/30/2017   BILITOT 0.6 07/11/2016   ALKPHOS 155 (H) 08/30/2017   ALKPHOS 72 07/11/2016   AST 55 (H) 08/30/2017   AST 29 07/11/2016   ALT 45 08/30/2017   ALT 28 07/11/2016  .  Micro Results Recent Results (from the past 240 hour(s))  MRSA PCR Screening  Status: None   Collection Time: 08/30/17  6:59 PM  Result Value Ref Range Status   MRSA by PCR NEGATIVE NEGATIVE Final    Comment:        The GeneXpert MRSA Assay (FDA approved for NASAL specimens only), is one component of a comprehensive MRSA colonization surveillance program. It is not intended to diagnose MRSA infection nor to guide or monitor treatment for MRSA infections. Performed at [facility], [address] [place], [place] [address]         Code Status Orders  (From admission, onward)        Start     Ordered   08/31/17 1306  Limited resuscitation (code)  Continuous    Question Answer Comment  In the event of cardiac or respiratory  ARREST: Initiate Code Blue, Call Rapid Response Yes   In the event of cardiac or respiratory ARREST: Perform CPR Yes   In the event of cardiac or respiratory ARREST: Perform Intubation/Mechanical Ventilation No   In the event of cardiac or respiratory ARREST: Use NIPPV/BiPAp only if indicated Yes   In the event of cardiac or respiratory ARREST: Administer ACLS medications if indicated Yes   In the event of cardiac or respiratory ARREST: Perform Defibrillation or Cardioversion if indicated No      08/31/17 1306    Code Status History    Date Active Date Inactive Code Status Order ID Comments User Context   08/30/2017 1619 08/31/2017 1304 Full Code [id number]  Loletha Grayer, MD ED   09/15/2015 2151 09/18/2015 1539 Full Code [id number]  Vaughan Basta, MD ED    Advance Directive Documentation     Most Recent Value  Type of Advance Directive  Healthcare Power of Attorney, Living will  Pre-existing out of facility DNR order (yellow form or pink MOST form)  -  "MOST" Form in Place?  -           Contact information for follow-up providers    Hooten, Laurice Record, MD Follow up in 2 week(s).   Specialty:  Orthopedic Surgery Contact information: [place] [address] [phone]        Ria Bush, MD Follow up in 6 day(s).   Specialty:  Family Medicine Contact information: [place] [address] [phone]            Contact information for after-discharge care    Destination    [facility] .   Service:  Skilled Nursing Contact information: [address] [place] [phone]                  Discharge Medications   Allergies as of 09/03/2017   No Known Allergies     Medication List    STOP taking these medications   ramucirumab in sodium chloride 0.9 %   TAXOL IV     TAKE these medications   acetaminophen 325 MG tablet Commonly known  as:  TYLENOL Take 1-2 tablets (325-650 mg total) by mouth every 6 (six) hours as needed for mild pain (pain score 1-3 or temp > 100.5).   alum & mag hydroxide-simeth 200-200-20 MG/5ML suspension Commonly known as:  MAALOX/MYLANTA Take 30 mLs by mouth every 6 (six) hours as needed for indigestion or heartburn.   B-12 PO Take 500 mcg by mouth daily.   baclofen 10 MG tablet Commonly known as:  LIORESAL Take 1 tablet by mouth 4 (four)  times daily.   dexamethasone 2 MG tablet Commonly known as:  DECADRON Take 1 tablet by mouth 2 (two) times daily.   docusate sodium 100 MG capsule Commonly known as:  COLACE Take 1 capsule (100 mg total) by mouth 2 (two) times daily.   FIRST-MOUTHWASH BLM Susp Take 5 mLs by mouth 4 (four) times daily.   gabapentin 300 MG capsule Commonly known as:  NEURONTIN Take 1 capsule by mouth daily.   lidocaine 2 % solution Commonly known as:  XYLOCAINE Use as directed 1.5 mLs in the mouth or throat every 6 (six) hours as needed for mouth pain.   losartan 25 MG tablet Commonly known as:  COZAAR Take 1 tablet by mouth daily.   multivitamin capsule Take 1 capsule by mouth daily.   OLANZapine zydis 5 MG disintegrating tablet Commonly known as:  ZYPREXA Take 1 tablet by mouth daily.   ondansetron 8 MG tablet Commonly known as:  ZOFRAN Take 8 mg by mouth every 8 (eight) hours as needed for nausea or vomiting.   Oxycodone HCl 10 MG Tabs Take 1 tablet (10 mg total) by mouth every 4 (four) hours as needed for severe pain (pain score 7-10).   pantoprazole 40 MG tablet Commonly known as:  PROTONIX Take 40 mg by mouth 2 (two) times daily before a meal.   rivaroxaban 20 MG Tabs tablet Commonly known as:  XARELTO Take 20 mg by mouth daily.   simvastatin 40 MG tablet Commonly known as:  ZOCOR Take 40 mg by mouth at bedtime.   SLOW RELEASE IRON PO Take 45 mg by mouth daily.   tamsulosin 0.4 MG Caps capsule Commonly known as:  FLOMAX Take 1 capsule  (0.4 mg total) by mouth daily for 5 days. Start taking on:  09/04/2017          Total Time in preparing paper work, data evaluation and todays exam - 64 minutes  Jason Cooper M.D on 09/03/2017 at 10:04 AM [place]  [phone]

## 2017-09-03 NOTE — Progress Notes (Signed)
PT Cancellation Note  Patient Details Name: Jason Cooper MRN: [id number] DOB: Aug 27, 1945   Cancelled Treatment:    Reason Eval/Treat Not Completed: Other (comment).  TA x 2 and the second time pt in more pain over full bladder with cath set to be done to alleviate symptoms.  Will see later if pt is able to do therapy and otherwise will see in AM.   Ramond Dial 09/03/2017, 2:53 PM   Mee Hives, PT MS Acute Rehab Dept. Number: [place]

## 2017-09-03 NOTE — Progress Notes (Signed)
  Subjective: 3 Days Post-Op Procedure(s) (LRB): INTRAMEDULLARY (IM) NAIL INTERTROCHANTRIC (Right) Patient reports pain as mild, increased with PT. Patient is well, and has had no acute complaints or problems Plan is for discharge to SNF when medically appropriate. Negative for chest pain and shortness of breath Fever: no Gastrointestinal:Negative for nausea and vomiting  Objective: Vital signs in last 24 hours: Temp:  [98 F (36.7 C)-98.3 F (36.8 C)] 98 F (36.7 C) (06/01 2305) Pulse Rate:  [95-102] 95 (06/01 2305) Resp:  [16-20] 16 (06/01 2305) BP: (157-160)/(90-92) 157/92 (06/01 2305) SpO2:  [94 %-95 %] 95 % (06/01 2305)  Intake/Output from previous day:  Intake/Output Summary (Last 24 hours) at 09/03/2017 0917 Last data filed at 09/02/2017 1500 Gross per 24 hour  Intake 460 ml  Output 400 ml  Net 60 ml    Intake/Output this shift: No intake/output data recorded.  Labs: Recent Labs    09/01/17 0427 09/02/17 0603 09/03/17 0546  HGB 8.9* 8.1* 8.2*   Recent Labs    09/02/17 0603 09/03/17 0546  WBC 2.9* 2.8*  RBC 2.62* 2.66*  HCT 24.1* 24.7*  PLT 95* 123*   Recent Labs    09/02/17 0603 09/03/17 0546  NA 138 138  K 4.7 5.0  CL 110 111  CO2 23 23  BUN 20 20  CREATININE 0.66 0.72  GLUCOSE 144* 156*  CALCIUM 7.8* 8.2*   No results for input(s): LABPT, INR in the last 72 hours.   EXAM General - Patient is Alert, Appropriate and Oriented Extremity - ABD soft Sensation intact distally Intact pulses distally Dorsiflexion/Plantar flexion intact Incision: moderate drainage No cellulitis present Dressing/Incision - Serosanguinous drainage to the right hip. Motor Function - intact, moving foot and toes well on exam. Abdomen soft with normal BS this morning.  Past Medical History:  Diagnosis Date  . Acute pulmonary embolism ([place]) 11/28/2015   Incidentally found on CT 11/2015, started lovenox  . Cluster headache    as 72 yo, no more since retired (25 yrs  ago)  . Coronary artery disease   . DVT (deep venous thrombosis) ([place])    2018  . Environmental allergies   . H/O malaria 1969   during [place] (hospitalized in [place])  . H/O varicella   . History of chicken pox   . HLD (hyperlipidemia)   . HTN (hypertension)   . Malignant neoplasm of lower third of esophagus ([place]) 09/15/2015   Followed at [facility] by rad onc and onc   . Osteoarthritis    s/p bilat knee replacements  . Prediabetes 2007  . Seasonal allergic rhinitis   . Seizures ([facility])     Assessment/Plan: 3 Days Post-Op Procedure(s) (LRB): INTRAMEDULLARY (IM) NAIL INTERTROCHANTRIC (Right) Active Problems:   Hip fracture (H[facility])   Malnutrition of moderate degree  Estimated body mass index is 25.46 kg/m as calculated from the following:   Height as of this encounter: 6\' 3"  (1.905 m).   Weight as of this encounter: 92.4 kg (203 lb 11.3 oz). Up with therapy   Labs reviewed this AM. Hg 8.2, HCT 24.7. Hypotension has resolved, d/c IVF.  Can proceed with pain medication.  Can look to transition back to home hypertension medications. Up with therapy today today.   Current plan will be for discharge to SNF when medically appropriate.  DVT Prophylaxis - Xarelto, Foot Pumps and TED hose Weight-Bearing as tolerated to right leg  J. Cameron Proud, PA-C [facility] Orthopaedic Surgery 09/03/2017, 9:17 AM

## 2017-09-04 ENCOUNTER — Encounter: Payer: Self-pay | Admitting: Family Medicine

## 2017-09-22 ENCOUNTER — Inpatient Hospital Stay: Payer: Medicare Other | Admitting: Family Medicine

## 2017-10-10 ENCOUNTER — Telehealth: Payer: Self-pay | Admitting: Family Medicine

## 2017-10-10 NOTE — Telephone Encounter (Signed)
Copied from [place] [phone]. Topic: Quick Communication - See Telephone Encounter >> Oct 10, 2017  1:05 PM Neva Seat wrote: Daughter of pt - Jason Cooper wanted to let Dr. Danise Mina pt passed away 10/23/2017.

## 2017-10-11 NOTE — Telephone Encounter (Signed)
Spoke with wife, offered my condolences.

## 2017-11-02 DEATH — deceased

## 2018-08-07 ENCOUNTER — Ambulatory Visit: Payer: Medicare Other

## 2018-08-09 ENCOUNTER — Encounter: Payer: Medicare Other | Admitting: Family Medicine
